# Patient Record
Sex: Male | Born: 1960 | Race: White | Hispanic: No | Marital: Single
Health system: Southern US, Community
[De-identification: ages and names within clinical notes are randomized; demographics above are authoritative.]

---

## 2005-01-12 ENCOUNTER — Emergency Department: Payer: Self-pay | Admitting: Emergency Medicine

## 2005-01-12 ENCOUNTER — Other Ambulatory Visit: Payer: Self-pay

## 2005-07-04 ENCOUNTER — Emergency Department: Payer: Self-pay | Admitting: Emergency Medicine

## 2006-05-29 ENCOUNTER — Emergency Department: Payer: Self-pay | Admitting: Emergency Medicine

## 2007-01-07 ENCOUNTER — Emergency Department: Payer: Self-pay | Admitting: Emergency Medicine

## 2008-01-11 IMAGING — CR DG CHEST 2V
1 series · 2 of 2 positions shown · non-contrast
Comparison: none

REASON FOR EXAM: sob
COMMENTS:

[Series 1: view not recorded · 0.17mm/px · 2 of 2 slices shown]
[im 1/2]
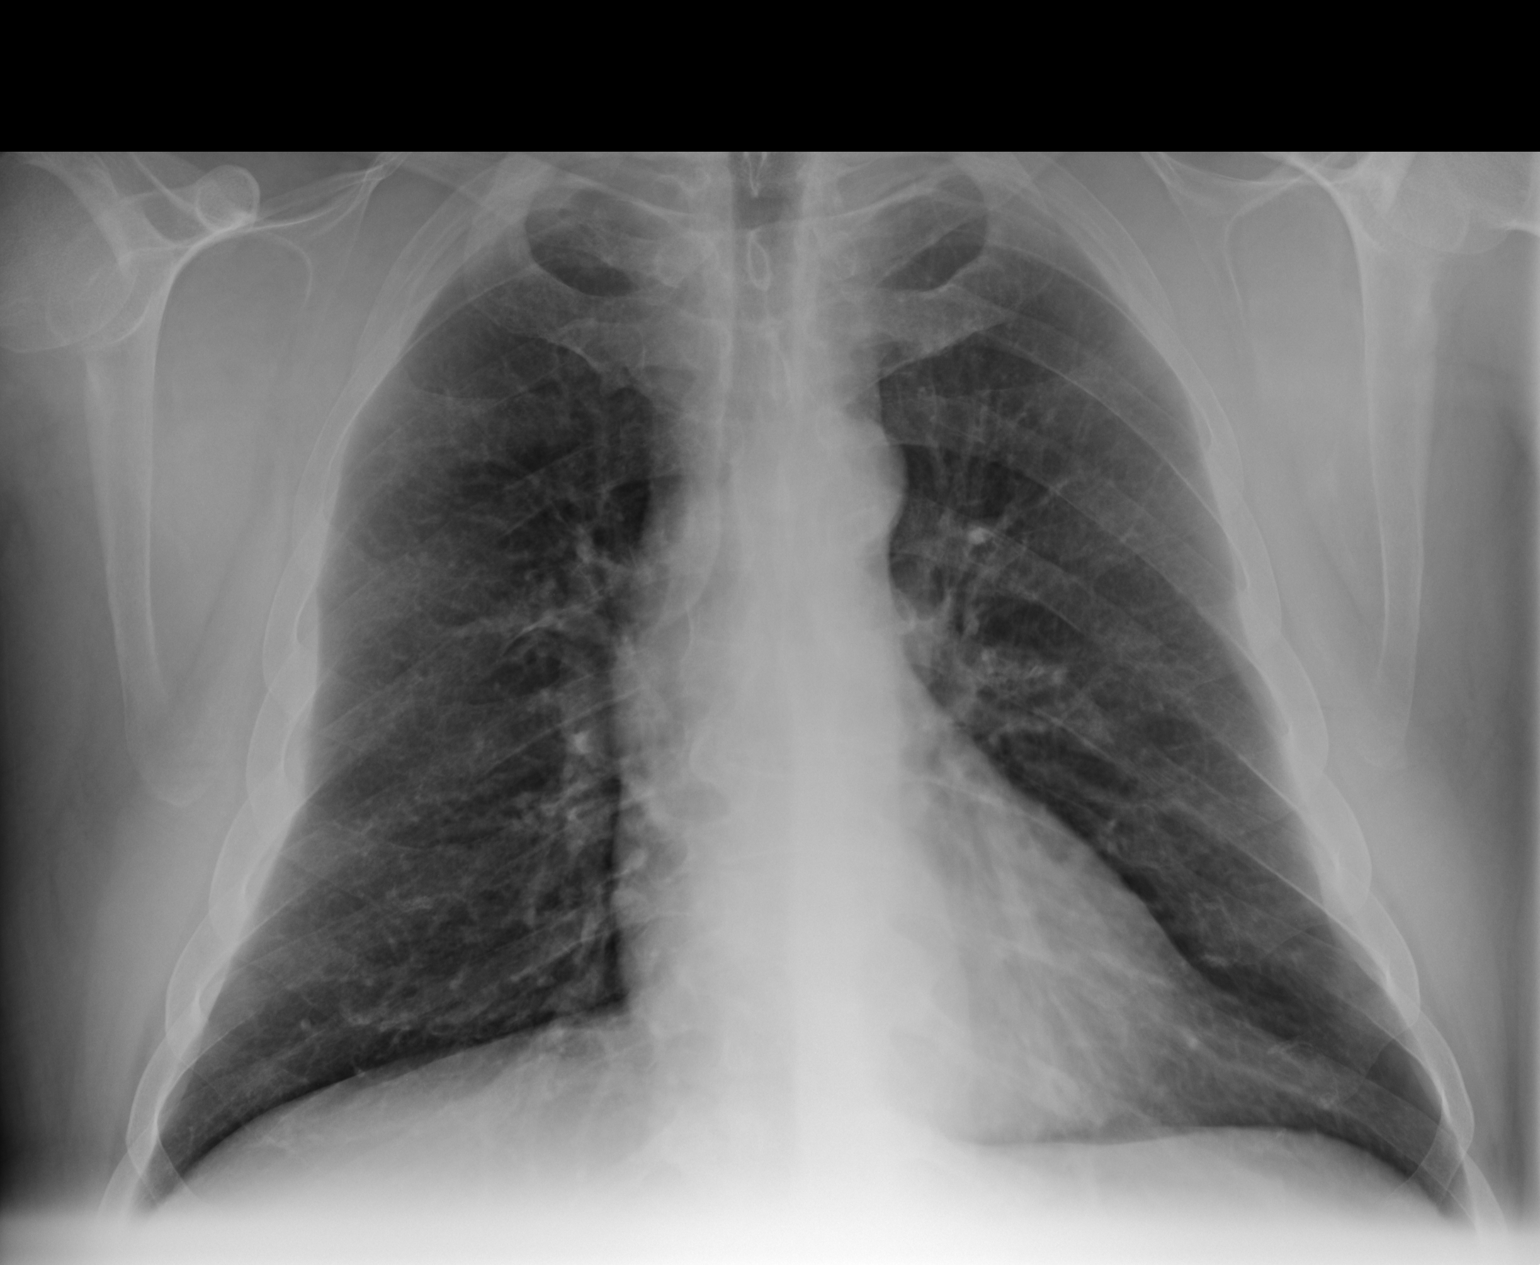
[im 2/2]
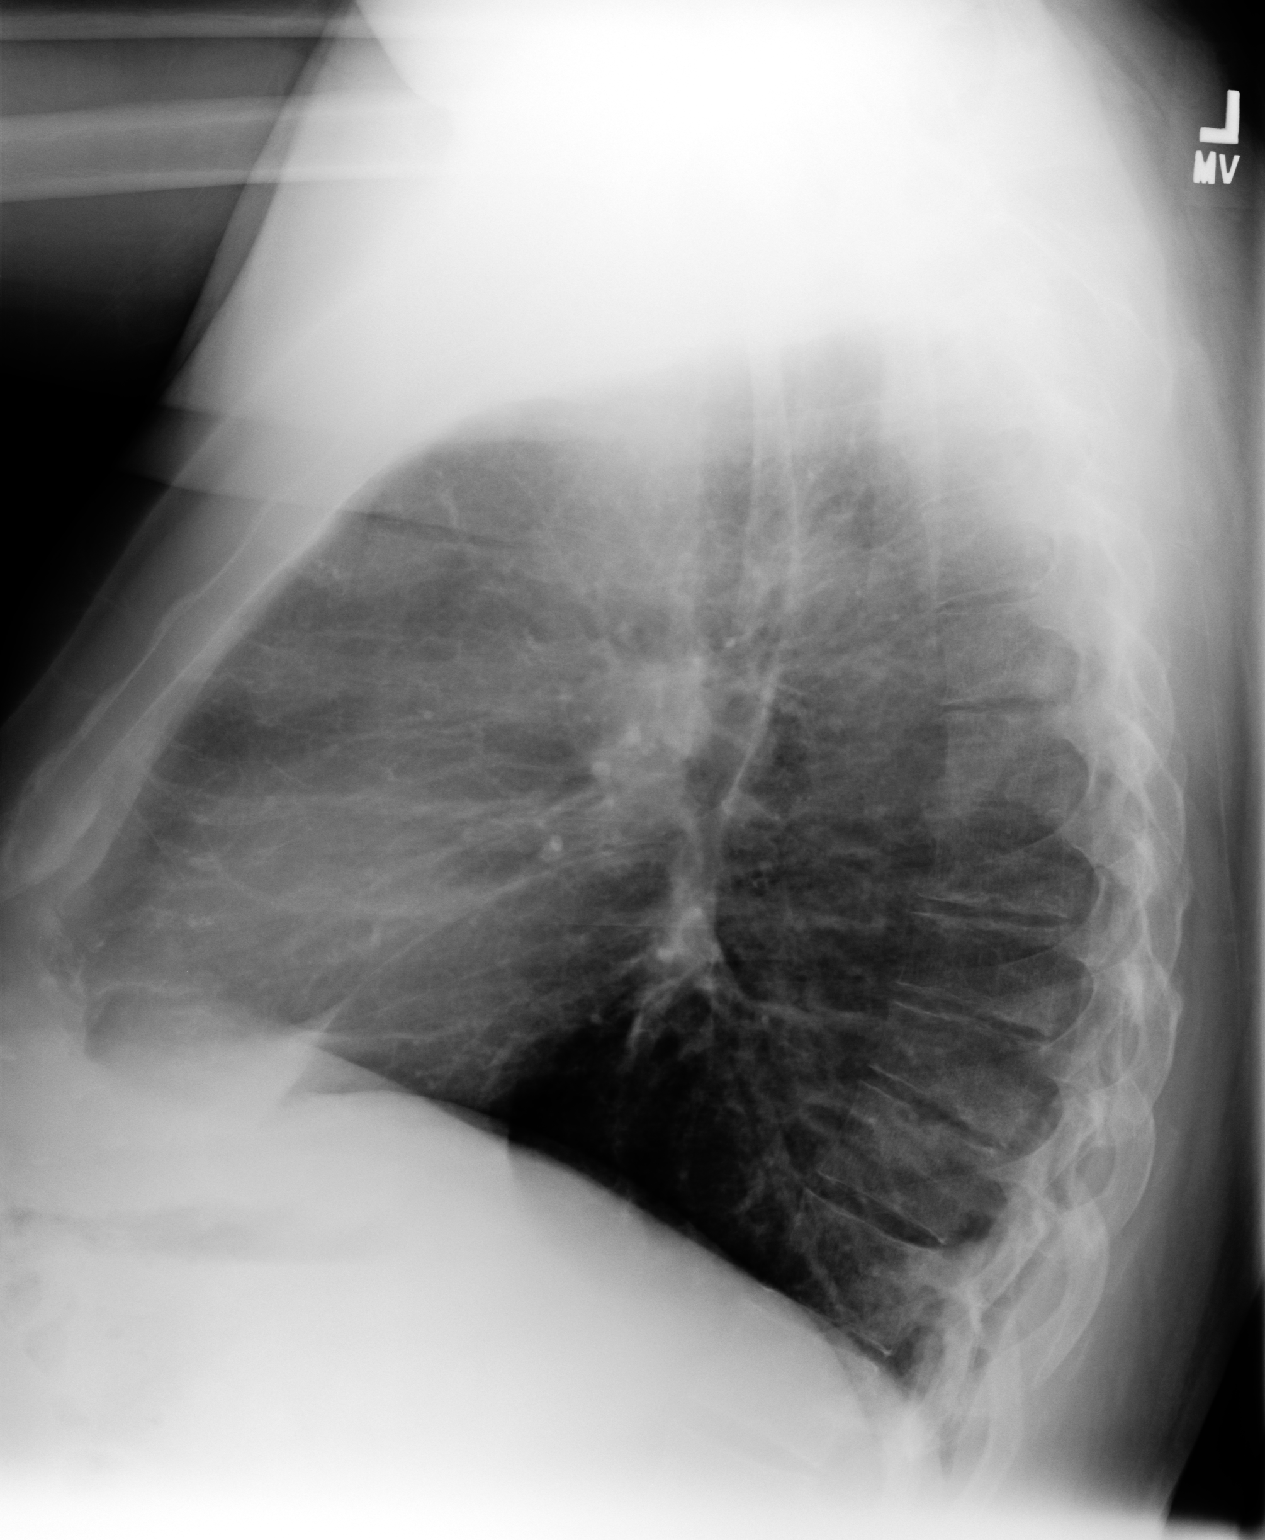

[2 of 2 positions shown; findings below may reference images not displayed]

PROCEDURE:     DXR - DXR CHEST PA (OR AP) AND LATERAL  - January 07, 2007  [DATE]

RESULT:     Comparison is made to the study dated 07/04/2005. The lungs are
hyperinflated. The lungs are clear. The heart and pulmonary vessels are
normal. The bony and mediastinal structures are unremarkable. There is no
effusion. There is no pneumothorax or evidence of congestive failure.
IMPRESSION: No acute cardiopulmonary disease. COPD changes.

## 2008-05-24 ENCOUNTER — Emergency Department: Payer: Self-pay | Admitting: Emergency Medicine

## 2011-04-30 ENCOUNTER — Emergency Department: Payer: Self-pay | Admitting: Emergency Medicine

## 2022-03-13 ENCOUNTER — Other Ambulatory Visit: Payer: Self-pay

## 2022-03-13 ENCOUNTER — Encounter
Admission: EM | Disposition: A | Discharge status: Home / Self Care | Payer: Self-pay | Source: Home / Self Care | Attending: Internal Medicine

## 2022-03-13 ENCOUNTER — Emergency Department: Payer: Medicare HMO

## 2022-03-13 ENCOUNTER — Inpatient Hospital Stay
Admission: EM | Admit: 2022-03-13 | Discharge: 2022-03-18 | DRG: 321 | Disposition: A | Discharge status: Home / Self Care | Payer: Medicare HMO | Attending: Internal Medicine | Admitting: Internal Medicine

## 2022-03-13 DIAGNOSIS — Z79899 Other long term (current) drug therapy: Secondary | ICD-10-CM | POA: Diagnosis not present

## 2022-03-13 DIAGNOSIS — L4050 Arthropathic psoriasis, unspecified: Secondary | ICD-10-CM

## 2022-03-13 DIAGNOSIS — Z6841 Body Mass Index (BMI) 40.0 and over, adult: Secondary | ICD-10-CM | POA: Diagnosis not present

## 2022-03-13 DIAGNOSIS — I5042 Chronic combined systolic (congestive) and diastolic (congestive) heart failure: Secondary | ICD-10-CM | POA: Diagnosis present

## 2022-03-13 DIAGNOSIS — Z955 Presence of coronary angioplasty implant and graft: Secondary | ICD-10-CM

## 2022-03-13 DIAGNOSIS — I472 Ventricular tachycardia, unspecified: Secondary | ICD-10-CM | POA: Diagnosis not present

## 2022-03-13 DIAGNOSIS — R310 Gross hematuria: Secondary | ICD-10-CM | POA: Diagnosis not present

## 2022-03-13 DIAGNOSIS — E785 Hyperlipidemia, unspecified: Secondary | ICD-10-CM | POA: Diagnosis present

## 2022-03-13 DIAGNOSIS — I2510 Atherosclerotic heart disease of native coronary artery without angina pectoris: Secondary | ICD-10-CM | POA: Diagnosis present

## 2022-03-13 DIAGNOSIS — J9601 Acute respiratory failure with hypoxia: Secondary | ICD-10-CM | POA: Diagnosis present

## 2022-03-13 DIAGNOSIS — F17210 Nicotine dependence, cigarettes, uncomplicated: Secondary | ICD-10-CM | POA: Diagnosis present

## 2022-03-13 DIAGNOSIS — I2111 ST elevation (STEMI) myocardial infarction involving right coronary artery: Secondary | ICD-10-CM | POA: Diagnosis present

## 2022-03-13 DIAGNOSIS — R9431 Abnormal electrocardiogram [ECG] [EKG]: Principal | ICD-10-CM

## 2022-03-13 DIAGNOSIS — G8929 Other chronic pain: Secondary | ICD-10-CM | POA: Diagnosis present

## 2022-03-13 DIAGNOSIS — G4733 Obstructive sleep apnea (adult) (pediatric): Secondary | ICD-10-CM | POA: Insufficient documentation

## 2022-03-13 DIAGNOSIS — J432 Centrilobular emphysema: Secondary | ICD-10-CM | POA: Diagnosis present

## 2022-03-13 DIAGNOSIS — G312 Degeneration of nervous system due to alcohol: Secondary | ICD-10-CM | POA: Insufficient documentation

## 2022-03-13 DIAGNOSIS — Z1152 Encounter for screening for COVID-19: Secondary | ICD-10-CM | POA: Diagnosis not present

## 2022-03-13 DIAGNOSIS — I252 Old myocardial infarction: Secondary | ICD-10-CM | POA: Diagnosis not present

## 2022-03-13 DIAGNOSIS — J441 Chronic obstructive pulmonary disease with (acute) exacerbation: Secondary | ICD-10-CM | POA: Insufficient documentation

## 2022-03-13 DIAGNOSIS — Z7982 Long term (current) use of aspirin: Secondary | ICD-10-CM

## 2022-03-13 DIAGNOSIS — I110 Hypertensive heart disease with heart failure: Secondary | ICD-10-CM | POA: Diagnosis present

## 2022-03-13 DIAGNOSIS — I2119 ST elevation (STEMI) myocardial infarction involving other coronary artery of inferior wall: Secondary | ICD-10-CM | POA: Diagnosis present

## 2022-03-13 DIAGNOSIS — M549 Dorsalgia, unspecified: Secondary | ICD-10-CM | POA: Diagnosis present

## 2022-03-13 DIAGNOSIS — J449 Chronic obstructive pulmonary disease, unspecified: Secondary | ICD-10-CM

## 2022-03-13 DIAGNOSIS — E1165 Type 2 diabetes mellitus with hyperglycemia: Secondary | ICD-10-CM | POA: Diagnosis present

## 2022-03-13 DIAGNOSIS — R079 Chest pain, unspecified: Secondary | ICD-10-CM | POA: Diagnosis present

## 2022-03-13 DIAGNOSIS — I10 Essential (primary) hypertension: Secondary | ICD-10-CM

## 2022-03-13 DIAGNOSIS — I4729 Other ventricular tachycardia: Secondary | ICD-10-CM | POA: Insufficient documentation

## 2022-03-13 DIAGNOSIS — F10931 Alcohol use, unspecified with withdrawal delirium: Secondary | ICD-10-CM | POA: Insufficient documentation

## 2022-03-13 DIAGNOSIS — F10231 Alcohol dependence with withdrawal delirium: Secondary | ICD-10-CM | POA: Diagnosis not present

## 2022-03-13 DIAGNOSIS — Z7951 Long term (current) use of inhaled steroids: Secondary | ICD-10-CM

## 2022-03-13 DIAGNOSIS — I5022 Chronic systolic (congestive) heart failure: Secondary | ICD-10-CM

## 2022-03-13 DIAGNOSIS — E66813 Obesity, class 3: Secondary | ICD-10-CM | POA: Insufficient documentation

## 2022-03-13 HISTORY — PX: CORONARY/GRAFT ACUTE MI REVASCULARIZATION: CATH118305

## 2022-03-13 HISTORY — PX: LEFT HEART CATH AND CORONARY ANGIOGRAPHY: CATH118249

## 2022-03-13 LAB — CBC WITH DIFFERENTIAL/PLATELET
Abs Immature Granulocytes: 0.08 10*3/uL — ABNORMAL HIGH (ref 0.00–0.07)
Basophils Absolute: 0.1 10*3/uL (ref 0.0–0.1)
Basophils Relative: 1 %
Eosinophils Absolute: 0.2 10*3/uL (ref 0.0–0.5)
Eosinophils Relative: 2 %
HCT: 44.1 % (ref 39.0–52.0)
Hemoglobin: 14.6 g/dL (ref 13.0–17.0)
Immature Granulocytes: 1 %
Lymphocytes Relative: 17 %
Lymphs Abs: 1.7 10*3/uL (ref 0.7–4.0)
MCH: 31.6 pg (ref 26.0–34.0)
MCHC: 33.1 g/dL (ref 30.0–36.0)
MCV: 95.5 fL (ref 80.0–100.0)
Monocytes Absolute: 1.1 10*3/uL — ABNORMAL HIGH (ref 0.1–1.0)
Monocytes Relative: 11 %
Neutro Abs: 7 10*3/uL (ref 1.7–7.7)
Neutrophils Relative %: 68 %
Platelets: 261 10*3/uL (ref 150–400)
RBC: 4.62 MIL/uL (ref 4.22–5.81)
RDW: 13 % (ref 11.5–15.5)
WBC: 10.2 10*3/uL (ref 4.0–10.5)
nRBC: 0 % (ref 0.0–0.2)

## 2022-03-13 LAB — POCT ACTIVATED CLOTTING TIME
Activated Clotting Time: 227 seconds
Activated Clotting Time: 281 seconds

## 2022-03-13 LAB — COMPREHENSIVE METABOLIC PANEL
ALT: 23 U/L (ref 0–44)
AST: 32 U/L (ref 15–41)
Albumin: 4 g/dL (ref 3.5–5.0)
Alkaline Phosphatase: 48 U/L (ref 38–126)
Anion gap: 9 (ref 5–15)
BUN: 21 mg/dL (ref 8–23)
CO2: 26 mmol/L (ref 22–32)
Calcium: 9.3 mg/dL (ref 8.9–10.3)
Chloride: 102 mmol/L (ref 98–111)
Creatinine, Ser: 1.11 mg/dL (ref 0.61–1.24)
GFR, Estimated: >60 mL/min (ref >60)
Glucose, Bld: 199 mg/dL — ABNORMAL HIGH (ref 70–99)
Potassium: 4.5 mmol/L (ref 3.5–5.1)
Sodium: 137 mmol/L (ref 135–145)
Total Bilirubin: 0.7 mg/dL (ref 0.3–1.2)
Total Protein: 7.9 g/dL (ref 6.5–8.1)

## 2022-03-13 LAB — LIPID PANEL
Cholesterol: 144 mg/dL (ref 0–200)
HDL: 43 mg/dL (ref >40)
LDL Cholesterol: 56 mg/dL (ref 0–99)
Total CHOL/HDL Ratio: 3.3 RATIO
Triglycerides: 224 mg/dL — ABNORMAL HIGH (ref <150)
VLDL: 45 mg/dL — ABNORMAL HIGH (ref 0–40)

## 2022-03-13 LAB — PROTIME-INR
INR: 1.1 (ref 0.8–1.2)
Prothrombin Time: 13.9 seconds (ref 11.4–15.2)

## 2022-03-13 LAB — APTT: aPTT: 27 seconds (ref 24–36)

## 2022-03-13 LAB — RESP PANEL BY RT-PCR (FLU A&B, COVID) ARPGX2
Influenza A by PCR: NEGATIVE
Influenza B by PCR: NEGATIVE
SARS Coronavirus 2 by RT PCR: NEGATIVE

## 2022-03-13 LAB — TROPONIN I (HIGH SENSITIVITY): Troponin I (High Sensitivity): 199 ng/L (ref <18)

## 2022-03-13 SURGERY — CORONARY/GRAFT ACUTE MI REVASCULARIZATION
Anesthesia: Moderate Sedation

## 2022-03-13 MED ORDER — FENTANYL CITRATE (PF) 100 MCG/2ML IJ SOLN
INTRAMUSCULAR | Status: AC
  Filled 2022-03-13: qty 2

## 2022-03-13 MED ORDER — FENTANYL CITRATE PF 50 MCG/ML IJ SOSY
25.0000 ug | PREFILLED_SYRINGE | Freq: Once | INTRAMUSCULAR | Status: AC
  Administered 2022-03-13: 25 ug via INTRAVENOUS
  Filled 2022-03-13: qty 1

## 2022-03-13 MED ORDER — FUROSEMIDE 10 MG/ML IJ SOLN
INTRAMUSCULAR | Status: AC
  Filled 2022-03-13: qty 4

## 2022-03-13 MED ORDER — TIROFIBAN HCL IV 12.5 MG/250 ML
INTRAVENOUS | Status: AC
  Filled 2022-03-13: qty 250

## 2022-03-13 MED ORDER — ENOXAPARIN SODIUM 80 MG/0.8ML IJ SOSY
0.5000 mg/kg | PREFILLED_SYRINGE | INTRAMUSCULAR | Status: DC
  Administered 2022-03-14: 70 mg via SUBCUTANEOUS
  Filled 2022-03-13: qty 0.8

## 2022-03-13 MED ORDER — METHYLPREDNISOLONE SODIUM SUCC 125 MG IJ SOLR
125.0000 mg | INTRAMUSCULAR | Status: AC
  Administered 2022-03-13: 125 mg via INTRAVENOUS
  Filled 2022-03-13: qty 2

## 2022-03-13 MED ORDER — FENTANYL CITRATE (PF) 100 MCG/2ML IJ SOLN
INTRAMUSCULAR | Status: DC | PRN
  Administered 2022-03-13: 25 ug via INTRAVENOUS
  Administered 2022-03-13: 50 ug via INTRAVENOUS
  Administered 2022-03-13: 25 ug via INTRAVENOUS

## 2022-03-13 MED ORDER — ATORVASTATIN CALCIUM 80 MG PO TABS
80.0000 mg | ORAL_TABLET | Freq: Every day | ORAL | Status: DC
  Administered 2022-03-14 – 2022-03-18 (×5): 80 mg via ORAL
  Filled 2022-03-13: qty 4
  Filled 2022-03-13 (×3): qty 1
  Filled 2022-03-13: qty 4

## 2022-03-13 MED ORDER — HEPARIN (PORCINE) IN NACL 1000-0.9 UT/500ML-% IV SOLN
INTRAVENOUS | Status: AC
  Filled 2022-03-13: qty 1000

## 2022-03-13 MED ORDER — IOHEXOL 300 MG/ML  SOLN
INTRAMUSCULAR | Status: DC | PRN
  Administered 2022-03-13: 230 mL

## 2022-03-13 MED ORDER — IPRATROPIUM-ALBUTEROL 0.5-2.5 (3) MG/3ML IN SOLN
3.0000 mL | Freq: Once | RESPIRATORY_TRACT | Status: DC
  Filled 2022-03-13 (×2): qty 3

## 2022-03-13 MED ORDER — LIDOCAINE HCL (PF) 1 % IJ SOLN
INTRAMUSCULAR | Status: DC | PRN
  Administered 2022-03-13: 2 mL

## 2022-03-13 MED ORDER — HEPARIN SODIUM (PORCINE) 1000 UNIT/ML IJ SOLN
INTRAMUSCULAR | Status: AC
  Filled 2022-03-13: qty 10

## 2022-03-13 MED ORDER — MIDAZOLAM HCL 2 MG/2ML IJ SOLN
INTRAMUSCULAR | Status: AC
  Filled 2022-03-13: qty 2

## 2022-03-13 MED ORDER — METOPROLOL TARTRATE 50 MG PO TABS
50.0000 mg | ORAL_TABLET | Freq: Two times a day (BID) | ORAL | Status: DC
  Administered 2022-03-14 – 2022-03-18 (×10): 50 mg via ORAL
  Filled 2022-03-13 (×10): qty 1

## 2022-03-13 MED ORDER — ASPIRIN 81 MG PO CHEW
81.0000 mg | CHEWABLE_TABLET | Freq: Every day | ORAL | Status: DC
  Administered 2022-03-14 – 2022-03-18 (×5): 81 mg via ORAL
  Filled 2022-03-13 (×5): qty 1

## 2022-03-13 MED ORDER — TICAGRELOR 90 MG PO TABS
ORAL_TABLET | ORAL | Status: AC
  Filled 2022-03-13: qty 2

## 2022-03-13 MED ORDER — SPIRONOLACTONE 25 MG PO TABS
25.0000 mg | ORAL_TABLET | Freq: Every day | ORAL | Status: DC
  Administered 2022-03-14 – 2022-03-18 (×5): 25 mg via ORAL
  Filled 2022-03-13 (×5): qty 1

## 2022-03-13 MED ORDER — ACETAMINOPHEN 325 MG PO TABS
650.0000 mg | ORAL_TABLET | Freq: Four times a day (QID) | ORAL | Status: DC | PRN
  Administered 2022-03-14 – 2022-03-17 (×5): 650 mg via ORAL
  Filled 2022-03-13 (×5): qty 2

## 2022-03-13 MED ORDER — TICAGRELOR 90 MG PO TABS
90.0000 mg | ORAL_TABLET | Freq: Two times a day (BID) | ORAL | Status: DC
  Administered 2022-03-14 – 2022-03-18 (×9): 90 mg via ORAL
  Filled 2022-03-13 (×9): qty 1

## 2022-03-13 MED ORDER — SODIUM CHLORIDE 0.9 % IV SOLN: INTRAVENOUS | Status: DC

## 2022-03-13 MED ORDER — LIDOCAINE HCL 1 % IJ SOLN
INTRAMUSCULAR | Status: AC
  Filled 2022-03-13: qty 20

## 2022-03-13 MED ORDER — ONDANSETRON HCL 4 MG/2ML IJ SOLN: 4.0000 mg | Freq: Four times a day (QID) | INTRAMUSCULAR | Status: DC | PRN

## 2022-03-13 MED ORDER — VERAPAMIL HCL 2.5 MG/ML IV SOLN
INTRAVENOUS | Status: DC | PRN
  Administered 2022-03-13: 2.5 mg via INTRAVENOUS

## 2022-03-13 MED ORDER — TRAZODONE HCL 50 MG PO TABS
25.0000 mg | ORAL_TABLET | Freq: Every evening | ORAL | Status: DC | PRN
  Administered 2022-03-14 – 2022-03-18 (×4): 25 mg via ORAL
  Filled 2022-03-13 (×4): qty 1

## 2022-03-13 MED ORDER — HEPARIN SODIUM (PORCINE) 1000 UNIT/ML IJ SOLN
INTRAMUSCULAR | Status: DC | PRN
  Administered 2022-03-13 (×3): 3000 [IU] via INTRAVENOUS
  Administered 2022-03-13: 7000 [IU] via INTRAVENOUS

## 2022-03-13 MED ORDER — CHLORHEXIDINE GLUCONATE CLOTH 2 % EX PADS
6.0000 | MEDICATED_PAD | Freq: Every day | CUTANEOUS | Status: DC
  Administered 2022-03-14 – 2022-03-18 (×5): 6 via TOPICAL

## 2022-03-13 MED ORDER — ORAL CARE MOUTH RINSE: 15.0000 mL | OROMUCOSAL | Status: DC | PRN

## 2022-03-13 MED ORDER — TIROFIBAN HCL IN NACL 5-0.9 MG/100ML-% IV SOLN
INTRAVENOUS | Status: AC | PRN
  Administered 2022-03-13: .15 ug/kg/min via INTRAVENOUS

## 2022-03-13 MED ORDER — FUROSEMIDE 10 MG/ML IJ SOLN
INTRAMUSCULAR | Status: DC | PRN
  Administered 2022-03-13: 40 mg via INTRAVENOUS

## 2022-03-13 MED ORDER — HEPARIN (PORCINE) IN NACL 1000-0.9 UT/500ML-% IV SOLN
INTRAVENOUS | Status: DC | PRN
  Administered 2022-03-13 (×2): 500 mL

## 2022-03-13 MED ORDER — ONDANSETRON HCL 4 MG PO TABS: 4.0000 mg | ORAL_TABLET | Freq: Four times a day (QID) | ORAL | Status: DC | PRN

## 2022-03-13 MED ORDER — ACETAMINOPHEN 650 MG RE SUPP: 650.0000 mg | Freq: Four times a day (QID) | RECTAL | Status: DC | PRN

## 2022-03-13 MED ORDER — LORAZEPAM 2 MG/ML IJ SOLN
INTRAMUSCULAR | Status: AC
  Administered 2022-03-14: 2 mg
  Filled 2022-03-13: qty 1

## 2022-03-13 MED ORDER — LOSARTAN POTASSIUM 50 MG PO TABS
100.0000 mg | ORAL_TABLET | Freq: Every day | ORAL | Status: DC
  Administered 2022-03-14 – 2022-03-18 (×5): 100 mg via ORAL
  Filled 2022-03-13 (×5): qty 2

## 2022-03-13 MED ORDER — TIROFIBAN (AGGRASTAT) BOLUS VIA INFUSION
INTRAVENOUS | Status: DC | PRN
  Administered 2022-03-13: 3492.5 ug via INTRAVENOUS

## 2022-03-13 MED ORDER — MAGNESIUM HYDROXIDE 400 MG/5ML PO SUSP: 30.0000 mL | Freq: Every day | ORAL | Status: DC | PRN

## 2022-03-13 MED ORDER — VERAPAMIL HCL 2.5 MG/ML IV SOLN
INTRAVENOUS | Status: AC
  Filled 2022-03-13: qty 2

## 2022-03-13 MED ORDER — TICAGRELOR 90 MG PO TABS
ORAL_TABLET | ORAL | Status: DC | PRN
  Administered 2022-03-13: 180 mg via ORAL

## 2022-03-13 MED ORDER — MIDAZOLAM HCL 2 MG/2ML IJ SOLN
INTRAMUSCULAR | Status: DC | PRN
  Administered 2022-03-13: 1 mg via INTRAVENOUS

## 2022-03-13 SURGICAL SUPPLY — 21 items
BALLN TREK RX 3.0X15 (BALLOONS) ×1
BALLOON TREK RX 3.0X15 (BALLOONS) IMPLANT
CATH 5FR JL3.5 JR4 ANG PIG MP (CATHETERS) IMPLANT
CATH VISTA GUIDE 6FR JR4 (CATHETERS) IMPLANT
DEVICE RAD TR BAND REGULAR (VASCULAR PRODUCTS) IMPLANT
GLIDESHEATH SLEND SS 6F .021 (SHEATH) IMPLANT
GUIDEWIRE INQWIRE 1.5J.035X260 (WIRE) IMPLANT
INQWIRE 1.5J .035X260CM (WIRE) ×1
KIT ENCORE 26 ADVANTAGE (KITS) IMPLANT
KIT SYRINGE INJ CVI SPIKEX1 (MISCELLANEOUS) IMPLANT
NDL PERC 18GX7CM (NEEDLE) IMPLANT
NEEDLE PERC 18GX7CM (NEEDLE) IMPLANT
PACK CARDIAC CATH (CUSTOM PROCEDURE TRAY) ×1 IMPLANT
PROTECTION STATION PRESSURIZED (MISCELLANEOUS) ×1
SET ATX SIMPLICITY (MISCELLANEOUS) IMPLANT
SHEATH AVANTI 6FR X 11CM (SHEATH) IMPLANT
STATION PROTECTION PRESSURIZED (MISCELLANEOUS) IMPLANT
STENT ONYX FRONTIER 3.0X18 (Permanent Stent) IMPLANT
TUBING CIL FLEX 10 FLL-RA (TUBING) IMPLANT
WIRE G HI TQ BMW 190 (WIRE) IMPLANT
WIRE GUIDERIGHT .035X150 (WIRE) IMPLANT

## 2022-03-13 NOTE — Assessment & Plan Note (Signed)
-   We will resume BiPAP nightly

## 2022-03-13 NOTE — ED Notes (Signed)
Pt A&Ox4. Pt ambulatory. Pt has HX of CAD, STEMix2, Stents, and COPD. Pt has been having chest pain for 4 days with it getting progressively worse today. Pt endorsing pain 8/10 after 159mcg Fentanyl given in field. Pt has wheezing noted and was placed on 4lpm O2 for comfort. Pt received a 542ml bolus of NS in the field.

## 2022-03-13 NOTE — Assessment & Plan Note (Addendum)
-   The patient underwent PCI and stent placement to his distal RCA. - He is admitted to an ICU bed. - We will continue high-dose statin therapy as well as beta-blocker therapy with Lopressor and ARB.  He will be continued on Aggrastat and Brilinta as well as aspirin. - Dr. Clayborn Bigness will follow with [place].

## 2022-03-13 NOTE — Assessment & Plan Note (Signed)
-   He has centrilobular emphysema. - We will continue him on as needed DuoNebs and hold off long-acting beta agonist.

## 2022-03-13 NOTE — ED Provider Notes (Signed)
[facility] Emergency Dept Provider Note    Event Date/Time   First MD Initiated Contact with Patient 03/13/22 2152     (approximate)   History   Chest pain  EM caveat, limitations due to concern for acute cardiopulmonary emergency  HPI  Willie Santos is a 61 y.o. male reports a history of previous heart attacks and COPD  Patient activated as possible STEMI by EMS.  Received aspirin and 100 mcg of fentanyl for pain.  He reports he had a lingering sense of chest pressure for the last 3 days but tonight it became sudden abrupt and severe with sweating and radiation up towards his neck.  He reports it seems reminiscent of previous heart attack treated at [facility]  He does smoke.  He takes aspirin daily.  He denies allergies to any medications.  He reports his pain was severe severe pressure tightness and also a little bit of a sharpness in his chest, but has been alleviated now and pain is about a 3 out of 10 but still somewhat present.     Physical Exam   Triage Vital Signs: ED Triage Vitals  Enc Vitals Group     BP 03/13/22 2155 (!) 139/99     Pulse Rate 03/13/22 2155 96     Resp 03/13/22 2155 19     Temp 03/13/22 2155 97.7 F (36.5 C)     Temp Source 03/13/22 2155 Oral     SpO2 03/13/22 2154 96 %     Weight --      Height --      Head Circumference --      Peak Flow --      Pain Score --      Pain Loc --      Pain Edu? --      Excl. in [place]? --     Most recent vital signs: Vitals:   03/13/22 2154 03/13/22 2155  BP:  (!) 139/99  Pulse:  96  Resp:  19  Temp:  97.7 F (36.5 C)  SpO2: 96% 96%     General: Awake, appears to have mild increased work of breathing but in no distress.  He is alert he is fully oriented. CV:  Good peripheral perfusion.  Normal heart tones no tachycardia. Resp:  Normal effort for mild expiratory wheezing.  He does not exhibit any accessory muscle use.  He is able to speak in clear sentences without  difficulty Abd:  No distention.  Protuberant but nontender. Other:  Appreciable lower extremity edema.  Warm well-perfused extremity   ED Results / Procedures / Treatments   Labs (all labs ordered are listed, but only abnormal results are displayed) Labs Reviewed  CBC WITH DIFFERENTIAL/PLATELET - Abnormal; Notable for the following components:      Result Value   Monocytes Absolute 1.1 (*)    Abs Immature Granulocytes 0.08 (*)    All other components within normal limits  RESP PANEL BY RT-PCR (FLU A&B, COVID) ARPGX2  HEMOGLOBIN A1C  PROTIME-INR  APTT  COMPREHENSIVE METABOLIC PANEL  LIPID PANEL  TROPONIN I (HIGH SENSITIVITY)     EKG  Patient's EKG at 2152 interpreted by me heart rate 95 QRS 120 QTc 450 ST segment elevation noted in inferior leads, mild depression in V2.  Probable old Q wave in V1.  EKG compared with previous EKG from 2006.  Concerning for acute ST elevation MI involving the inferior leads   RADIOLOGY  Increased interstitial markings bilaterally,  can't exclude infiltrates.  Interpreted by me on an emergent basis this patient is planned to go emergently to cardiac catheterization.  No noted mediastinal widening or pneumothorax   PROCEDURES:  Critical Care performed: Yes, see critical care procedure note(s)  Procedures CRITICAL CARE Performed by: Delman Kitten   Total critical care time: 30 minutes  Critical care time was exclusive of separately billable procedures and treating other patients.  Critical care was necessary to treat or prevent imminent or life-threatening deterioration.  Critical care was time spent personally by me on the following activities: development of treatment plan with patient and/or surrogate as well as nursing, discussions with consultants, evaluation of patient's response to treatment, examination of patient, obtaining history from patient or surrogate, ordering and performing treatments and interventions, ordering and review of  laboratory studies, ordering and review of radiographic studies, pulse oximetry and re-evaluation of patient's condition.   MEDICATIONS ORDERED IN ED: Medications  0.9 %  sodium chloride infusion (has no administration in time range)  ipratropium-albuterol (DUONEB) 0.5-2.5 (3) MG/3ML nebulizer solution 3 mL (3 mLs Nebulization Given 03/13/22 2207)  methylPREDNISolone sodium succinate (SOLU-MEDROL) 125 mg/2 mL injection 125 mg (125 mg Intravenous Given 03/13/22 2207)  fentaNYL (SUBLIMAZE) injection 25 mcg (25 mcg Intravenous Given 03/13/22 2207)     IMPRESSION / MDM / ASSESSMENT AND PLAN / ED COURSE  I reviewed the triage vital signs and the nursing notes.                              Differential diagnosis includes, but is not limited to, ACS, dissection though seemingly less likely given the clinical history no moving tearing or pain in the back, COPD, respiratory etiology, reactive airway disease, NSTEMI, PE etc. are all considered.  Based on the patient's initial presentation the clinical history provided is EKG my upfront early concern is for acute coronary syndrome including inferior STEMI.  Code STEMI has been activated appropriately by EMS, I have affirmed as of 9:55 PM Dr. Clayborn Bigness is on his way to the hospital at this time  Patient's pain is about 3 out of 10 now after receiving 324 mg of aspirin by EMS and 100 mcg of fentanyl.  Patient is awake and alert.  He does additionally have wheezing in the lung fields but reports that that is his "normal breathing".  He denies that he is having acute respiratory distress but uses nebulizers regularly at home.  He is a smoker.  In addition, he reports that he had severe crushing chest pain occurring this evening with this the sweating and severe symptoms that are now starting to alleviate after EMS treatments.  He does have a history of previous coronary disease and cardiac stenting at [facility]  We will provide nebulizer treatment as the  patient does have wheezing and a history of COPD, though from his descriptor it seems that this is fairly close to his baseline.  He reports he chronically wheezes and his current breathing feels normal for him.  Always very atypical as the severe chest pain he experiences evening.  Patient's presentation is most consistent with acute presentation with potential threat to life or bodily function.  The patient is on the cardiac monitor to evaluate for evidence of arrhythmia and/or significant heart rate changes.  Clinical Course as of 03/13/22 2214  Nancy Fetter Mar 13, 2022  2211 Patient to cath lab escorted by MD [facility].  Dr. Clayborn Bigness Advised no further  recommendations for ED medications, did not wish to initiate heparin in the ED. [MQ]    Clinical Course User Index [MQ] Delman Kitten, MD   Patient has left the emergency department, now under the care of the cardiology team.  CBC is normal.  Further tests are pending will be followed up by cardiology team, and I have also made our critical care medicine team aware of the patient's presentation.  At this juncture, seems possible inferior STEMI, to the Cath Lab for further evaluation.  Anticipate further work-up care and treatment under the care of the cardiology and critical care medicine teams  ----------------------------------------- 11:27 PM on 03/13/2022 ----------------------------------------- Negative COVID.  Initial troponin elevated at 199.  Normal comprehensive metabolic panel  FINAL CLINICAL IMPRESSION(S) / ED DIAGNOSES   Final diagnoses:  Inferior ST segment elevation  COPD exacerbation ([place])     Rx / DC Orders   ED Discharge Orders     None        Note:  This document was prepared using Dragon voice recognition software and may include unintentional dictation errors.   Delman Kitten, MD 03/13/22 2328

## 2022-03-13 NOTE — Assessment & Plan Note (Addendum)
-   He is on weekly methotrexate and Humira .

## 2022-03-13 NOTE — ED Triage Notes (Signed)
Per pt has been havin chest pain for 4 days with it getting worse today. Pt has HX of 2 STEMI and stents, and COPD

## 2022-03-13 NOTE — Consult Note (Signed)
CARDIOLOGY CONSULT NOTE               Patient ID: Willie Santos MRN: [id number] DOB/AGE: Apr 18, 1961 61 y.o.  Admit date: 03/13/2022 Referring Physician Dr. Jacqualine Code emergency room [facility] Primary Cardiologist  Reason for Consultation STEMI  HPI: Patient was brought in to [facility] as a code STEMI EMS activated in the field the patient was quite a bit of distance from the hospital no EKGs were transported or history was given in the field after discussion with emergency room doctor he informed me that once he sees the patient has EKG then he will call me and let me know whether it is real or not since there was no information given originally.  Once patient presents emergency room Dr. Georgiana Spinner suggested was probably a inferior STEMI so we proceeded to bring the patient directly to the cardiac Cath Lab with intention for PCI and stent if possible to treat STEMI patient was improving in the emergency room only given aspirin no heparin no nitrates symptoms been ongoing for the last 3 days but got progressively worse the night of admission where he had some sweating midsternal chest pain diaphoresis weakness fatigue shortness of breath so family called EMS who then brought him to the emergency room.  Patient has history of known coronary disease PCI and stent in the past at [facility] where he gets most of his care  Review of systems complete and found to be negative unless listed above     No past medical history on file.    Medications Prior to Admission  Medication Sig Dispense Refill Last Dose   Adalimumab (HUMIRA PEN) 40 MG/0.4ML PNKT Inject 1 Dose into the skin once a week.   Past Week   albuterol (PROVENTIL) (2.5 MG/3ML) 0.083% nebulizer solution Take 2.5 mg by nebulization every 4 (four) hours as needed.   prn at prn   albuterol (VENTOLIN HFA) 108 (90 Base) MCG/ACT inhaler Inhale 1-2 puffs into the lungs every 4 (four) hours as needed.   prn at prn   aspirin EC 81 MG  tablet Take 1 tablet by mouth daily.   Past Week   atorvastatin (LIPITOR) 40 MG tablet Take 1 tablet by mouth daily.   Past Week   Fluticasone-Umeclidin-Vilant (TRELEGY ELLIPTA) 100-62.5-25 MCG/ACT AEPB Inhale 1 puff into the lungs daily.   Past Week   folic acid (FOLVITE) 1 MG tablet Take 1 tablet by mouth daily.   Past Week   gabapentin (NEURONTIN) 300 MG capsule Take 600 mg by mouth at bedtime.   Past Week   ipratropium-albuterol (DUONEB) 0.5-2.5 (3) MG/3ML SOLN Inhale 3 mLs into the lungs every 6 (six) hours as needed.   Past Month   losartan (COZAAR) 100 MG tablet Take 100 mg by mouth daily.   Past Week   methotrexate (RHEUMATREX) 2.5 MG tablet Take 20 mg by mouth once a week. Caution:Chemotherapy. Protect from light.   Past Week   metoprolol tartrate (LOPRESSOR) 25 MG tablet Take 25 mg by mouth 2 (two) times daily.   Past Week   oxyCODONE (OXY IR/ROXICODONE) 5 MG immediate release tablet Take 5 mg by mouth 4 (four) times daily as needed.   Past Week   spironolactone (ALDACTONE) 25 MG tablet Take 25 mg by mouth daily.   Past Week   Social History   Socioeconomic History   Marital status: Single    Spouse name: Not on file   Number of children: Not on file  Years of education: Not on file   Highest education level: Not on file  Occupational History   Not on file  Tobacco Use   Smoking status: Not on file   Smokeless tobacco: Not on file  Substance and Sexual Activity   Alcohol use: Not on file   Drug use: Not on file   Sexual activity: Not on file  Other Topics Concern   Not on file  Social History Narrative   Not on file   Social Determinants of Health   Financial Resource Strain: Not on file  Food Insecurity: Not on file  Transportation Needs: Not on file  Physical Activity: Not on file  Stress: Not on file  Social Connections: Not on file  Intimate Partner Violence: Not on file    No family history on file.    Review of systems complete and found to be negative  unless listed above      PHYSICAL EXAM  General: Well developed, well nourished, in no acute distress HEENT:  Normocephalic and atramatic Neck:  No JVD.  Lungs: Clear bilaterally to auscultation and percussion. Heart: HRRR . Normal S1 and S2 without gallops or murmurs.  Abdomen: Bowel sounds are positive, abdomen soft and non-tender  Msk:  Back normal, normal gait. Normal strength and tone for age. Extremities: No clubbing, cyanosis or edema.   Neuro: Alert and oriented X 3. Psych:  Good affect, responds appropriately  Labs:   Lab Results  Component Value Date   WBC 10.2 03/13/2022   HGB 14.6 03/13/2022   HCT 44.1 03/13/2022   MCV 95.5 03/13/2022   PLT 261 03/13/2022    Recent Labs  Lab 03/13/22 2155  NA 137  K 4.5  CL 102  CO2 26  BUN 21  CREATININE 1.11  CALCIUM 9.3  PROT 7.9  BILITOT 0.7  ALKPHOS 48  ALT 23  AST 32  GLUCOSE 199*   No results found for: "CKTOTAL", "CKMB", "CKMBINDEX", "TROPONINI"  Lab Results  Component Value Date   CHOL 144 03/13/2022   Lab Results  Component Value Date   HDL 43 03/13/2022   Lab Results  Component Value Date   LDLCALC 56 03/13/2022   Lab Results  Component Value Date   TRIG 224 (H) 03/13/2022   Lab Results  Component Value Date   CHOLHDL 3.3 03/13/2022   No results found for: "LDLDIRECT"    Radiology: [facility] Chest Port 1 View  Result Date: 03/13/2022 CLINICAL DATA:  Chest pain; STEMI suspected EXAM: PORTABLE CHEST 1 VIEW COMPARISON:  radiographs 05/24/2008 FINDINGS: Defibrillator pad over the left chest. Mild cardiomegaly. Bilateral interstitial coarsening. No focal consolidation, pleural effusion, or pneumothorax. No acute osseous abnormality. IMPRESSION: Bilateral interstitial coarsening may be due to COPD though superimposed edema could be considered. Cardiomegaly. Electronically Signed   By: Minerva Fester M.D.   On: 03/13/2022 22:11    EKG: Normal sinus rhythm subtle ST elevation inferiorly reciprocal  depressions anteriorly suggestive of possible STEMI heart rate of 80  ASSESSMENT AND PLAN:  STEMI inferior wall Known coronary artery disease Previous PCI and stent Obesity Psoriatic arthritis COPD Hyperlipidemia Hypertension Probable obstructive sleep apnea Chronic back pain . Plan We will admit intention to treat take to the cardiac catheter PCI and stent to distal RCA Follow-up troponins EKGs Transferred to ICU for recovery Maintain Aggrastat for 18 hours postintervention Continue aspirin and Brilinta we will continue beta-blocker ARB and Lipitor statin Recommend weight loss exercise portion control Consider sleep study for obstructive  sleep apnea CPAP if indicated We will need to continue Humira methotrexate for psoriatic arthritis symptoms Inhalers for COPD Advised patient refrain from tobacco abuse Recommend patient follow-up with cardiac rehab upon discharge  Signed: Alwyn Pea MD 03/13/2022, 11:17 PM

## 2022-03-13 NOTE — Assessment & Plan Note (Signed)
-   We will continue his antihypertensives. 

## 2022-03-13 NOTE — Progress Notes (Signed)
PHARMACIST - PHYSICIAN COMMUNICATION  CONCERNING:  Enoxaparin (Lovenox) for DVT Prophylaxis    RECOMMENDATION: Patient was prescribed enoxaprin 40mg  q24 hours for VTE prophylaxis.   Filed Weights   03/13/22 2156  Weight: (!) 139.7 kg (308 lb)    Body mass index is 41.77 kg/m.  Estimated Creatinine Clearance: 98.7 mL/min (by C-G formula based on SCr of 1.11 mg/dL).   Based on [place] patient is candidate for enoxaparin 0.5mg /kg TBW SQ every 24 hours based on BMI being >30.  DESCRIPTION: Pharmacy has adjusted enoxaparin dose per [facility] policy.  Patient is now receiving enoxaparin 0.5 mg/kg every 24 hours   Renda Rolls, PharmD, [facility] 03/13/2022 11:47 PM

## 2022-03-13 NOTE — Assessment & Plan Note (Signed)
-   We will continue statin therapy. 

## 2022-03-13 NOTE — H&P (Signed)
[place]   PATIENT NAME: Willie Santos    MR#:  [id number]  DATE OF BIRTH:  [birth year]  DATE OF ADMISSION:  03/13/2022  PRIMARY CARE PHYSICIAN: No primary care provider on file.   Patient is coming from: Home  REQUESTING/REFERRING PHYSICIAN: Tonette Bihari, MD  CHIEF COMPLAINT:    HISTORY OF PRESENT ILLNESS:  Willie Santos is a 61 y.o. obese Caucasian male with medical history significant for Coronary artery disease, combined systolic and diastolic CHF essential hypertension, emphysema, OSA, psoriatic arthritis, depression, dyslipidemia and history of tobacco and alcohol abuse, who presented to the emergency room with acute onset of chest pain, that has been lingering chest pressure over the last 3 days but became severe and sudden and abrupt tonight with associated diaphoresis and radiation up to his neck with fatigue and generalized weakness that made his family to call EMS.  This felt similar to an MI that was previously treated at [facility]  He described his pain as tightness as well as and has improved in the ER.  No reported fever or chills.  No reported nausea or vomiting or abdominal pain.  No reported dysuria, oliguria or hematuria or flank pain.  No reported cough or wheezing or hemoptysis.  No recent travels or surgeries.  He was fairly somnolent in the ICU due to agitation and desatting that resolved by being placed on his nighttime BiPAP and he was given Ativan prior to my interview.  ED Course: Upon presentation to the emergency room BP was 139/99 with pulse symmetry of 96% on 4 L O2 by nasal cannula and otherwise normal vital signs.  Labs revealed unremarkable CMP except for blood glucose of 199.  High sensitive troponin was 199 and lipid profile showed triglycerides of 224 with LDL of 56 and HDL 43 and total cholesterol of 144.  CBC was within normal.  Coagulation profile was within normal.  Influenza antigens and COVID-19 PCR came back negative. EKG as reviewed  by me : EKG showed sinus tachycardia with a rate of 103 and nonspecific intraventricular conduction delay with ST segment elevation inferiorly and lateral ST segment depression. Imaging: EKG showed portable chest x-ray showed cardiomegaly and bilateral interstitial coarsening that may be due to COPD though superimposed edema could be considered.   Dr. Clayborn Bigness was notified about the patient for a code STEMI.  The patient was taken to the Cath Lab and underwent PCI with stent placement to his distal RCA.  He will be admitted to an ICU bed for further  management. PAST MEDICAL HISTORY:  - Coronary artery disease - Essential hypertension - Centrilobular emphysema - Obstructive sleep apnea - Psoriatic arthritis - Depression - Morbid obesity - Dyslipidemia - History of tobacco abuse - History of alcohol abuse -HFrEF with EF of 30 to 35% and grade 2 diastolic dysfunction on 2D echo in April 2022 PAST SURGICAL HISTORY:  PCI and stent to his RCA tonight PCI and stent to his mid and distal LAD stent in 2014 and 2015 Knee replacement in 2021 SOCIAL HISTORY:   Social History   Tobacco Use   Smoking status: Not on file   Smokeless tobacco: Not on file  Substance Use Topics   Alcohol use: Not on file  Positive for tobacco abuse.  FAMILY HISTORY:  No family history on file.  No pertinent or obtainable familial diseases.  DRUG ALLERGIES:  No Known Allergies  REVIEW OF SYSTEMS:   ROS As per history of present illness. All  pertinent systems were reviewed above. Constitutional, HEENT, cardiovascular, respiratory, GI, GU, musculoskeletal, neuro, psychiatric, endocrine, integumentary and hematologic systems were reviewed and are otherwise negative/unremarkable except for positive findings mentioned above in the HPI.   MEDICATIONS AT HOME:   Prior to Admission medications   Medication Sig Start Date End Date Taking? Authorizing Provider  Adalimumab (HUMIRA PEN) 40 MG/0.4ML PNKT Inject 1  Dose into the skin once a week. 04/09/21  Yes [provider]  albuterol (PROVENTIL) (2.5 MG/3ML) 0.083% nebulizer solution Take 2.5 mg by nebulization every 4 (four) hours as needed. 09/30/20  Yes [provider]  albuterol (VENTOLIN HFA) 108 (90 Base) MCG/ACT inhaler Inhale 1-2 puffs into the lungs every 4 (four) hours as needed. 07/24/20  Yes [provider]  aspirin EC 81 MG tablet Take 1 tablet by mouth daily. 06/21/19  Yes [provider]  atorvastatin (LIPITOR) 40 MG tablet Take 1 tablet by mouth daily. 09/07/21 09/07/22 Yes [provider]  Fluticasone-Umeclidin-Vilant (TRELEGY ELLIPTA) 100-62.5-25 MCG/ACT AEPB Inhale 1 puff into the lungs daily. 02/18/22  Yes [provider]  folic acid (FOLVITE) 1 MG tablet Take 1 tablet by mouth daily. 10/27/21 10/27/22 Yes [provider]  gabapentin (NEURONTIN) 300 MG capsule Take 600 mg by mouth at bedtime.   Yes [provider]  ipratropium-albuterol (DUONEB) 0.5-2.5 (3) MG/3ML SOLN Inhale 3 mLs into the lungs every 6 (six) hours as needed. 07/24/20  Yes [provider]  losartan (COZAAR) 100 MG tablet Take 100 mg by mouth daily.   Yes [provider]  methotrexate (RHEUMATREX) 2.5 MG tablet Take 20 mg by mouth once a week. Caution:Chemotherapy. Protect from light.   Yes [provider]  metoprolol tartrate (LOPRESSOR) 25 MG tablet Take 25 mg by mouth 2 (two) times daily. 03/02/22  Yes [provider]  oxyCODONE (OXY IR/ROXICODONE) 5 MG immediate release tablet Take 5 mg by mouth 4 (four) times daily as needed.   Yes [provider]  spironolactone (ALDACTONE) 25 MG tablet Take 25 mg by mouth daily. 12/02/20  Yes [provider]      VITAL SIGNS:  Blood pressure (!) 139/99, pulse 96, temperature 97.7 F (36.5 C), temperature source Oral, resp. rate 19, height 6' (1.829 m), weight (!) 139.7 kg, SpO2 96 %.  PHYSICAL EXAMINATION:   Physical Exam  GENERAL:  61 y.o.-year-old obese patient male patient lying in the bed with mild respiratory distress currently on BiPAP.  He was very somnolent after being given IV Ativan. EYES: Pupils equal, round, reactive to light and accommodation. No scleral icterus. Extraocular muscles intact.  HEENT: Head atraumatic, normocephalic. Oropharynx and nasopharynx clear.  NECK:  Supple, no jugular venous distention. No thyroid enlargement, no tenderness.  LUNGS: Normal breath sounds bilaterally, no wheezing, rales,rhonchi or crepitation. No use of accessory muscles of respiration.  CARDIOVASCULAR: Regular rate and rhythm, S1, S2 normal. No murmurs, rubs, or gallops.  ABDOMEN: Soft, nondistended, nontender. Bowel sounds present. No organomegaly or mass.  EXTREMITIES: No pedal edema, cyanosis, or clubbing.  NEUROLOGIC: Cranial nerves II through XII are intact. Muscle strength 5/5 in all extremities. Sensation intact. Gait not checked.  PSYCHIATRIC: The patient is alert and oriented x 3.  Normal affect and good eye contact. SKIN: Diffuse psoriatic eruption with erythematous papular silvery scaly eruptions mainly involving both lower extremities and lower abdomen.   LABORATORY PANEL:   CBC Recent Labs  Lab 03/13/22 2155  WBC 10.2  HGB 14.6  HCT 44.1  PLT 261   ------------------------------------------------------------------------------------------------------------------  Chemistries  Recent Labs  Lab 03/13/22 2155  NA 137  K 4.5  CL 102  CO2 26  GLUCOSE 199*  BUN 21  CREATININE 1.11  CALCIUM 9.3  AST 32  ALT 23  ALKPHOS 48  BILITOT 0.7   ------------------------------------------------------------------------------------------------------------------  Cardiac Enzymes No results for input(s): "TROPONINI" in the last 168 hours. ------------------------------------------------------------------------------------------------------------------  RADIOLOGY:  DG Chest  Port 1 View  Result Date: 03/13/2022 CLINICAL DATA:  Chest pain; STEMI suspected EXAM: PORTABLE CHEST 1 VIEW COMPARISON:  radiographs 05/24/2008 FINDINGS: Defibrillator pad over the left chest. Mild cardiomegaly. Bilateral interstitial coarsening. No focal consolidation, pleural effusion, or pneumothorax. No acute osseous abnormality. IMPRESSION: Bilateral interstitial coarsening may be due to COPD though superimposed edema could be considered. Cardiomegaly. Electronically Signed   By: Minerva Fester M.D.   On: 03/13/2022 22:11      IMPRESSION AND PLAN:  Assessment and Plan: * STEMI involving right coronary artery (HCC) - The patient underwent PCI and stent placement to his distal RCA. - He is admitted to an ICU bed. - We will continue high-dose statin therapy as well as beta-blocker therapy with Lopressor and ARB.  He will be continued on Aggrastat and Brilinta as well as aspirin. - Dr. Juliann Pares will follow with [place].  Essential hypertension - We will continue his antihypertensives.  Dyslipidemia - We will continue statin therapy.  Chronic obstructive pulmonary disease (COPD) (HCC) - He has centrilobular emphysema. - We will continue him on as needed DuoNebs and hold off long-acting beta agonist.  Chronic HFrEF (heart failure with reduced ejection fraction) (HCC) - We will continue his Lasix diuresis, beta-blocker therapy and ARB as well as Aldactone.  Psoriatic arthritis (HCC) - He is on weekly methotrexate and Humira .  Obstructive sleep apnea - We will resume BiPAP nightly   DVT prophylaxis: Lovenox.  Advanced Care Planning:  Code Status: full code.  Family Communication:  The plan of care was discussed in details with the patient (and family). I answered all questions. The patient agreed to proceed with the above mentioned plan. Further management will depend upon hospital course. Disposition Plan: Back to previous home environment Consults called:Cardiology All the  records are reviewed and case discussed with ED provider.  Status is: Inpatient   At the time of the admission, it appears that the appropriate admission status for this patient is inpatient.  This is judged to be reasonable and necessary in order to provide the required intensity of service to ensure the patient's safety given the presenting symptoms, physical exam findings and initial radiographic and laboratory data in the context of comorbid conditions.  The patient requires inpatient status due to high intensity of service, high risk of further deterioration and high frequency of surveillance required.  I certify that at the time of admission, it is my clinical judgment that the patient will require inpatient hospital care extending more than 2 midnights.                            Dispo: The patient is from: Home              Anticipated d/c is to: Home              Patient currently is not medically stable to d/c.              Difficult to place patient: No  Hannah Beat M.D on 03/14/2022 at 12:21 AM  [facility]   From 7 PM-7 AM, contact night-coverage [email or web address]  CC: Primary care physician; No primary care provider on file.

## 2022-03-13 NOTE — Progress Notes (Signed)
[facility] Progress Note Patient Name: [name] DOB: Willie 15, 1962 MRN: [id number]   Date of Service  03/13/2022  HPI/Events of Note  61/M who presents with chest pain. CODE STEMI was activated and pt was emergently brought to the cath lab. Coronary angiography revealed 100% distal lesion of the RCA. PCI with DES to RCA lesion performed. Pt tolerated procedure, no complications reported.  eICU Interventions  -admit to ICU -Currently chest pain free. Will plan to give PRN NTG if with recurrence - Continue DAPT.  - Plan to continue statin, ACE, beta blocker if without contraindication.  - Serial EKG, trend troponin to peak - Plan for echo in AM - No severe electrolyte abnormalities on baseline BMP. Will continue to monitor - Maintain normoglycemia - DVT Propyhlaxis - lovenox        Brittany Osier M DELA CRUZ 03/13/2022, 11:33 PM

## 2022-03-13 NOTE — CV Procedure (Signed)
Brief STEMI note  Indication inferior STEMI  Patient brought in via EMS with what appeared to be inferior STEMI with borderline EKG changes with persistent pain symptoms known coronary disease.  Patient had mild EKG changes inferiorly reciprocal changes anteriorly.  Patient was brought to the cardiac Cath Lab for emergent treatment  Cath Left ventricular enlargement with preserved left ventricular function around 50 to 55%  Coronary Left main large minor irregularities LAD large widely patent proximal stent minor irregularities Circumflex large mild to moderate disease RCA large 100% distal lesion TIMI 0 flow IRA  Intervention Excessive PCI and stent to distal RCA placement of a DES stent 3.0 x 18 mm [place] Onyx to 13 atm reducing lesion from 100 down to 0% restoring TIMI-3 flow from TIMI 0  Patient had procedure done radially with heparin but placed on Aggrastat postprocedure for 18 hours patient given 180 mg of Brilinta placed on 90 twice a day aspirin 325  Patient being transferred to ICU for convalescence hospitalist Dr. Sidney Ace was consulted for medical management  Patient tolerated procedure well no complications

## 2022-03-13 NOTE — ED Notes (Signed)
Cardiologist Orick in [place] at this time

## 2022-03-14 ENCOUNTER — Inpatient Hospital Stay: Payer: Medicare HMO

## 2022-03-14 ENCOUNTER — Inpatient Hospital Stay
Admit: 2022-03-14 | Discharge: 2022-03-14 | Disposition: A | Discharge status: Home / Self Care | Payer: Medicare HMO | Attending: Cardiology | Admitting: Cardiology

## 2022-03-14 DIAGNOSIS — J9601 Acute respiratory failure with hypoxia: Secondary | ICD-10-CM | POA: Insufficient documentation

## 2022-03-14 DIAGNOSIS — I4729 Other ventricular tachycardia: Secondary | ICD-10-CM | POA: Insufficient documentation

## 2022-03-14 DIAGNOSIS — F10931 Alcohol use, unspecified with withdrawal delirium: Secondary | ICD-10-CM | POA: Insufficient documentation

## 2022-03-14 DIAGNOSIS — I5022 Chronic systolic (congestive) heart failure: Secondary | ICD-10-CM

## 2022-03-14 LAB — CBC
HCT: 49.1 % (ref 39.0–52.0)
Hemoglobin: 16.1 g/dL (ref 13.0–17.0)
MCH: 31.5 pg (ref 26.0–34.0)
MCHC: 32.8 g/dL (ref 30.0–36.0)
MCV: 96.1 fL (ref 80.0–100.0)
Platelets: 299 10*3/uL (ref 150–400)
RBC: 5.11 MIL/uL (ref 4.22–5.81)
RDW: 13.1 % (ref 11.5–15.5)
WBC: 9.4 10*3/uL (ref 4.0–10.5)
nRBC: 0 % (ref 0.0–0.2)

## 2022-03-14 LAB — BLOOD GAS, ARTERIAL
Acid-Base Excess: 3.4 mmol/L — ABNORMAL HIGH (ref 0.0–2.0)
Bicarbonate: 29.6 mmol/L — ABNORMAL HIGH (ref 20.0–28.0)
O2 Content: 10 L/min
O2 Saturation: 99.1 %
Patient temperature: 37
pCO2 arterial: 50 mmHg — ABNORMAL HIGH (ref 32–48)
pH, Arterial: 7.38 (ref 7.35–7.45)
pO2, Arterial: 119 mmHg — ABNORMAL HIGH (ref 83–108)

## 2022-03-14 LAB — BASIC METABOLIC PANEL
Anion gap: 11 (ref 5–15)
BUN: 26 mg/dL — ABNORMAL HIGH (ref 8–23)
CO2: 26 mmol/L (ref 22–32)
Calcium: 9.6 mg/dL (ref 8.9–10.3)
Chloride: 103 mmol/L (ref 98–111)
Creatinine, Ser: 0.92 mg/dL (ref 0.61–1.24)
GFR, Estimated: >60 mL/min (ref >60)
Glucose, Bld: 195 mg/dL — ABNORMAL HIGH (ref 70–99)
Potassium: 5 mmol/L (ref 3.5–5.1)
Sodium: 140 mmol/L (ref 135–145)

## 2022-03-14 LAB — CARDIAC CATHETERIZATION: Cath EF Quantitative: 50 %

## 2022-03-14 LAB — BRAIN NATRIURETIC PEPTIDE: B Natriuretic Peptide: 177.7 pg/mL — ABNORMAL HIGH (ref 0.0–100.0)

## 2022-03-14 LAB — HEMOGLOBIN A1C
Hgb A1c MFr Bld: 6.8 % — ABNORMAL HIGH (ref 4.8–5.6)
Mean Plasma Glucose: 148.46 mg/dL

## 2022-03-14 LAB — MRSA NEXT GEN BY PCR, NASAL: MRSA by PCR Next Gen: NOT DETECTED

## 2022-03-14 LAB — HIV ANTIBODY (ROUTINE TESTING W REFLEX): HIV Screen 4th Generation wRfx: NONREACTIVE

## 2022-03-14 LAB — GLUCOSE, CAPILLARY: Glucose-Capillary: 148 mg/dL — ABNORMAL HIGH (ref 70–99)

## 2022-03-14 LAB — TROPONIN I (HIGH SENSITIVITY): Troponin I (High Sensitivity): >24000 ng/L (ref <18)

## 2022-03-14 MED ORDER — FOLIC ACID 1 MG PO TABS
1.0000 mg | ORAL_TABLET | Freq: Every day | ORAL | Status: DC
  Filled 2022-03-14: qty 1

## 2022-03-14 MED ORDER — METHYLPREDNISOLONE SODIUM SUCC 40 MG IJ SOLR
40.0000 mg | Freq: Two times a day (BID) | INTRAMUSCULAR | Status: DC
  Administered 2022-03-14 – 2022-03-16 (×4): 40 mg via INTRAVENOUS
  Filled 2022-03-14 (×4): qty 1

## 2022-03-14 MED ORDER — LORAZEPAM 1 MG PO TABS
1.0000 mg | ORAL_TABLET | ORAL | Status: AC | PRN
  Administered 2022-03-15: 1 mg via ORAL
  Filled 2022-03-14: qty 1

## 2022-03-14 MED ORDER — ADULT MULTIVITAMIN W/MINERALS CH
1.0000 | ORAL_TABLET | Freq: Every day | ORAL | Status: DC
  Administered 2022-03-14 – 2022-03-18 (×5): 1 via ORAL
  Filled 2022-03-14 (×5): qty 1

## 2022-03-14 MED ORDER — HEPARIN BOLUS VIA INFUSION
6550.0000 [IU] | Freq: Once | INTRAVENOUS | Status: DC
  Filled 2022-03-14: qty 6550

## 2022-03-14 MED ORDER — ACETAMINOPHEN 325 MG PO TABS: 650.0000 mg | ORAL_TABLET | ORAL | Status: DC | PRN

## 2022-03-14 MED ORDER — ENOXAPARIN SODIUM 80 MG/0.8ML IJ SOSY
70.0000 mg | PREFILLED_SYRINGE | INTRAMUSCULAR | Status: DC
  Administered 2022-03-15 – 2022-03-16 (×2): 70 mg via SUBCUTANEOUS
  Filled 2022-03-14 (×2): qty 0.8

## 2022-03-14 MED ORDER — SODIUM CHLORIDE 0.9 % WEIGHT BASED INFUSION
1.0000 mL/kg/h | INTRAVENOUS | Status: AC
  Administered 2022-03-14: 1 mL/kg/h via INTRAVENOUS

## 2022-03-14 MED ORDER — THIAMINE HCL 100 MG/ML IJ SOLN
100.0000 mg | Freq: Every day | INTRAMUSCULAR | Status: DC
  Filled 2022-03-14: qty 2

## 2022-03-14 MED ORDER — METHOTREXATE SODIUM 2.5 MG PO TABS
20.0000 mg | ORAL_TABLET | ORAL | Status: DC
  Administered 2022-03-14: 20 mg via ORAL
  Filled 2022-03-14: qty 8

## 2022-03-14 MED ORDER — OXYCODONE HCL 5 MG PO TABS
5.0000 mg | ORAL_TABLET | ORAL | Status: DC | PRN
  Administered 2022-03-14 – 2022-03-18 (×15): 5 mg via ORAL
  Filled 2022-03-14 (×15): qty 1

## 2022-03-14 MED ORDER — TIROFIBAN HCL IV 12.5 MG/250 ML: 0.1500 ug/kg/min | INTRAVENOUS | Status: AC

## 2022-03-14 MED ORDER — ONDANSETRON HCL 4 MG/2ML IJ SOLN: 4.0000 mg | Freq: Four times a day (QID) | INTRAMUSCULAR | Status: DC | PRN

## 2022-03-14 MED ORDER — PERFLUTREN LIPID MICROSPHERE
1.0000 mL | INTRAVENOUS | Status: AC | PRN
  Administered 2022-03-14: 2 mL via INTRAVENOUS

## 2022-03-14 MED ORDER — LORAZEPAM 2 MG/ML IJ SOLN: 1.0000 mg | INTRAMUSCULAR | Status: AC | PRN

## 2022-03-14 MED ORDER — FOLIC ACID 1 MG PO TABS
1.0000 mg | ORAL_TABLET | Freq: Every day | ORAL | Status: DC
  Administered 2022-03-14 – 2022-03-18 (×5): 1 mg via ORAL
  Filled 2022-03-14 (×5): qty 1

## 2022-03-14 MED ORDER — GABAPENTIN 300 MG PO CAPS
600.0000 mg | ORAL_CAPSULE | Freq: Every day | ORAL | Status: DC
  Administered 2022-03-14 – 2022-03-17 (×4): 600 mg via ORAL
  Filled 2022-03-14 (×4): qty 2

## 2022-03-14 MED ORDER — LABETALOL HCL 5 MG/ML IV SOLN: 10.0000 mg | INTRAVENOUS | Status: AC | PRN

## 2022-03-14 MED ORDER — SODIUM CHLORIDE 0.9 % IV SOLN: 250.0000 mL | INTRAVENOUS | Status: DC | PRN

## 2022-03-14 MED ORDER — SODIUM CHLORIDE 0.9% FLUSH: 3.0000 mL | INTRAVENOUS | Status: DC | PRN

## 2022-03-14 MED ORDER — NICOTINE 21 MG/24HR TD PT24
21.0000 mg | MEDICATED_PATCH | Freq: Every day | TRANSDERMAL | Status: DC
  Administered 2022-03-15 – 2022-03-18 (×4): 21 mg via TRANSDERMAL
  Filled 2022-03-14 (×4): qty 1

## 2022-03-14 MED ORDER — LOSARTAN POTASSIUM 50 MG PO TABS: 100.0000 mg | ORAL_TABLET | Freq: Every day | ORAL | Status: DC

## 2022-03-14 MED ORDER — ATORVASTATIN CALCIUM 20 MG PO TABS: 40.0000 mg | ORAL_TABLET | Freq: Every day | ORAL | Status: DC

## 2022-03-14 MED ORDER — LORAZEPAM 2 MG/ML IJ SOLN: 2.0000 mg | Freq: Once | INTRAMUSCULAR | Status: AC

## 2022-03-14 MED ORDER — HEPARIN (PORCINE) 25000 UT/250ML-% IV SOLN: 1750.0000 [IU]/h | INTRAVENOUS | Status: DC

## 2022-03-14 MED ORDER — IPRATROPIUM-ALBUTEROL 0.5-2.5 (3) MG/3ML IN SOLN
3.0000 mL | Freq: Four times a day (QID) | RESPIRATORY_TRACT | Status: DC
  Administered 2022-03-14 – 2022-03-18 (×15): 3 mL via RESPIRATORY_TRACT
  Filled 2022-03-14 (×16): qty 3
  Filled 2022-03-14: qty 30

## 2022-03-14 MED ORDER — SODIUM CHLORIDE 0.9 % IV SOLN: INTRAVENOUS | Status: DC

## 2022-03-14 MED ORDER — IPRATROPIUM-ALBUTEROL 0.5-2.5 (3) MG/3ML IN SOLN
3.0000 mL | Freq: Four times a day (QID) | RESPIRATORY_TRACT | Status: DC | PRN
  Administered 2022-03-16: 3 mL via RESPIRATORY_TRACT
  Filled 2022-03-14 (×2): qty 3

## 2022-03-14 MED ORDER — HYDRALAZINE HCL 20 MG/ML IJ SOLN: 10.0000 mg | INTRAMUSCULAR | Status: AC | PRN

## 2022-03-14 MED ORDER — SPIRONOLACTONE 25 MG PO TABS: 25.0000 mg | ORAL_TABLET | Freq: Every day | ORAL | Status: DC

## 2022-03-14 MED ORDER — SODIUM CHLORIDE 0.9% FLUSH
3.0000 mL | Freq: Two times a day (BID) | INTRAVENOUS | Status: DC
  Administered 2022-03-14 – 2022-03-18 (×8): 3 mL via INTRAVENOUS

## 2022-03-14 MED ORDER — METOPROLOL TARTRATE 25 MG PO TABS: 25.0000 mg | ORAL_TABLET | Freq: Two times a day (BID) | ORAL | Status: DC

## 2022-03-14 MED ORDER — LORAZEPAM 2 MG/ML IJ SOLN
0.5000 mg | INTRAMUSCULAR | Status: DC | PRN
  Administered 2022-03-14: 0.5 mg via INTRAVENOUS
  Filled 2022-03-14: qty 1

## 2022-03-14 MED ORDER — THIAMINE MONONITRATE 100 MG PO TABS
100.0000 mg | ORAL_TABLET | Freq: Every day | ORAL | Status: DC
  Administered 2022-03-14 – 2022-03-18 (×5): 100 mg via ORAL
  Filled 2022-03-14 (×5): qty 1

## 2022-03-14 NOTE — Assessment & Plan Note (Signed)
-   We will continue his Lasix diuresis, beta-blocker therapy and ARB as well as Aldactone.

## 2022-03-14 NOTE — Progress Notes (Signed)
[facility] Cardiology    SUBJECTIVE: Patient states to feel much better has had some difficulty urinating requiring Foley also requiring BiPAP for shortness of breath sitting up in a chair he has chronic back pain denies any chest pain feels much improved   Vitals:   03/14/22 0222 03/14/22 0300 03/14/22 0400 03/14/22 0700  BP:  93/69 102/74 96/70  Pulse: 80 85 81 73  Resp: 13 16 14 14   Temp:      TempSrc:      SpO2: 96% 96% 96% 97%  Weight:      Height:         Intake/Output Summary (Last 24 hours) at 03/14/2022 13/10/2021 Last data filed at 03/14/2022 0300 Gross per 24 hour  Intake 912.27 ml  Output 1100 ml  Net -187.73 ml      PHYSICAL EXAM  General: Well developed, well nourished, in no acute distress HEENT:  Normocephalic and atramatic Neck:  No JVD.  Lungs: Clear bilaterally to auscultation and percussion. Heart: HRRR . Normal S1 and S2 without gallops or murmurs.  Abdomen: Bowel sounds are positive, abdomen soft and non-tender  Msk:  Back normal, normal gait. Normal strength and tone for age. Extremities: No clubbing, cyanosis or edema.   Neuro: Alert and oriented X 3. Psych:  Good affect, responds appropriately   LABS: Basic Metabolic Panel: Recent Labs    03/13/22 2155  NA 137  K 4.5  CL 102  CO2 26  GLUCOSE 199*  BUN 21  CREATININE 1.11  CALCIUM 9.3   Liver Function Tests: Recent Labs    03/13/22 2155  AST 32  ALT 23  ALKPHOS 48  BILITOT 0.7  PROT 7.9  ALBUMIN 4.0   No results for input(s): "LIPASE", "AMYLASE" in the last 72 hours. CBC: Recent Labs    03/13/22 2155  WBC 10.2  NEUTROABS 7.0  HGB 14.6  HCT 44.1  MCV 95.5  PLT 261   Cardiac Enzymes: No results for input(s): "CKTOTAL", "CKMB", "CKMBINDEX", "TROPONINI" in the last 72 hours. BNP: Invalid input(s): "POCBNP" D-Dimer: No results for input(s): "DDIMER" in the last 72 hours. Hemoglobin A1C: Recent Labs    03/13/22 2155  HGBA1C 6.8*   Fasting Lipid Panel: Recent Labs     03/13/22 2155  CHOL 144  HDL 43  LDLCALC 56  TRIG 224*  CHOLHDL 3.3   Thyroid Function Tests: No results for input(s): "TSH", "T4TOTAL", "T3FREE", "THYROIDAB" in the last 72 hours.  Invalid input(s): "FREET3" Anemia Panel: No results for input(s): "VITAMINB12", "FOLATE", "FERRITIN", "TIBC", "IRON", "RETICCTPCT" in the last 72 hours.  DG Chest Port 1 View  Result Date: 03/13/2022 CLINICAL DATA:  Chest pain; STEMI suspected EXAM: PORTABLE CHEST 1 VIEW COMPARISON:  radiographs 05/24/2008 FINDINGS: Defibrillator pad over the left chest. Mild cardiomegaly. Bilateral interstitial coarsening. No focal consolidation, pleural effusion, or pneumothorax. No acute osseous abnormality. IMPRESSION: Bilateral interstitial coarsening may be due to COPD though superimposed edema could be considered. Cardiomegaly. Electronically Signed   By: 05/26/2008 M.D.   On: 03/13/2022 22:11     Echo pending  TELEMETRY: Normal sinus rhythm rate of 80ST-T changes:  ASSESSMENT AND PLAN:  Principal Problem:   STEMI involving right coronary artery (HCC) Active Problems:   Acute ST elevation myocardial infarction (STEMI) involving other coronary artery of inferior wall (HCC)   Dyslipidemia   Essential hypertension   Chronic obstructive pulmonary disease (COPD) (HCC)   Psoriatic arthritis (HCC)   Obstructive sleep apnea   Chronic HFrEF (heart  failure with reduced ejection fraction) ([place])    Plan Status post STEMI PCI and stent to distal RCA DES continue Brilinta aspirin wean Aggrastat after a total of 18 hours Respiratory distress COPD recommend BiPAP diuretics Sleep study CPAP if indicated for probable obstructive sleep apnea Continue hypertension management and control Recommend continue statin therapy for hyperlipidemia Follow-up troponins EKGs maintain beta-blockers ACE or ARB aspirin statin Brilinta Recommend significant weight loss exercise portion control .  Refer patient to cardiac rehab upon  discharge Hopefully transfer to floor bed tomorrow   Yolonda Kida, MD 03/14/2022 7:28 AM

## 2022-03-14 NOTE — Progress Notes (Addendum)
Progress Note   Patient: Willie Santos QZR:[id number] DOB: 1960-07-05 DOA: 03/13/2022     1 DOS: the patient was seen and examined on 03/14/2022   Brief hospital course: Willie Santos is a 61 y.o. obese Caucasian male with medical history significant for Coronary artery disease, combined systolic and diastolic CHF essential hypertension, emphysema, OSA, psoriatic arthritis, depression, dyslipidemia and history of tobacco and alcohol abuse, who presented to the emergency room with acute onset of chest pain.  Peak troponin was more than 24,000.  EKG showed ST elevation.  Patient with emergency brought to the Cath Lab, drug-eluting eluding stent was placed in RCA, patient started on dual antiplatelet treatment. Overnight over the procedure, patient developed significant agitation and confusion.  He was also placed on BiPAP for hypoxemia.  Assessment and Plan:  Acute hypoxemic respiratory failure. Developed significant respiratory distress as well as hypoxemia last night in the setting of altered mental status and agitation.  Reviewed patient chest x-ray, has some bilateral lower lobe atelectasis. Repeated chest x-ray today.  Due to significant agitation and confusion last night, patient could have an aspiration event. Check a BNP and a pending echocardiogram, will discontinue IV fluids for now.  Patient may need diuretics if significant elevation of BNP. Another consideration is that the patient has a severe sleep apnea, with altered mental status, patient could develop hypoxemia.  Obtain ABG to assess the need for BiPAP at time of discharge.  STEMI secondary to obstructed RCA. Nonsustained ventricular tachycardia. Patient is status post RCA drug-eluting stent.  Continue dual antiplatelets as well as statin. Patient had nonsustained ventricular tachycardia of 8 beats, most likely this is secondary to reperfusion arrhythmia.  Continue to follow closely.  Delirium tremens with alcohol  abuse. Patient is a heavy alcohol drinker, per agitated and confused last night.  We will start CIWA protocol, also start thiamine and folic acid.  COPD. Obstructive sleep apnea. Continue bronchodilator, patient was not on BiPAP at home.  We will assess for the need.  Chronic systolic congestive heart failure. Pending echocardiogram.  Assess volume status with BNP.  Chest x-ray showed some bilateral lower lobe atelectasis.  May need diuretics.  Discontinued IV fluids.  Morbid obesity. Diet exercise.  Psoriasis. Psoriatic arthritis. Patient has chronic skin rash and a psoriatic arthritis.  Currently on methotrexate and Humira.    Addendum: 1546, BNP 177, repeated chest x-ray did not show any changes compared to prior to someone.  Patient unlikely to had aspiration.  ABG showed pH of 7.38, CO2 of 50,  O2 of 119.  Patient is still requiring BiPAP.  Patient  has COPD, I will start IV steroids. Patient states that he has significant shortness of breath with any activity at baseline, even when urinating. This could be all due to severe COPD. Suspicion for PE is low. He also has gross hematuria, will not start iv heparin. Continue prophylactic lovenox Continue monitor closely in the ICU stepdown unit.   Subjective:  Patient has been very combative and confused last night.  Had another episode of agitation this morning. Denies any chest pain or shortness of breath.  Physical Exam: Vitals:   03/14/22 1030 03/14/22 1100 03/14/22 1130 03/14/22 1200  BP: 101/60 (!) 115/98 110/71 104/69  Pulse: 84 92 88 83  Resp: 19     Temp:      TempSrc:      SpO2: 96% 96% 96% 99%  Weight:      Height:  General exam: Appears calm and comfortable  Respiratory system: Clear to auscultation. Respiratory effort normal. Cardiovascular system: S1 & S2 heard, RRR. No JVD, murmurs, rubs, gallops or clicks. No pedal edema. Gastrointestinal system: Abdomen is nondistended, soft and nontender. No  organomegaly or masses felt. Normal bowel sounds heard. Central nervous system: Alert and oriented x2. No focal neurological deficits. Extremities: Symmetric 5 x 5 power. Skin: No rashes, lesions or ulcers Psychiatry:  Mood & affect appropriate.   Data Reviewed:  Reviewed chest x-ray, heart cath report, lab results, EKG.  Family Communication: Son updated at bedside.  Disposition: Status is: Inpatient Remains inpatient appropriate because: Severity of disease, IV treatment.  Altered mental status.  Planned Discharge Destination: Home with Home Health    Time spent: 55 minutes  Author: Sharen Hones, MD 03/14/2022 12:13 PM  For on call review [email or web address].

## 2022-03-14 NOTE — Hospital Course (Signed)
Willie Santos is a 61 y.o. obese Caucasian male with medical history significant for Coronary artery disease, combined systolic and diastolic CHF essential hypertension, emphysema, OSA, psoriatic arthritis, depression, dyslipidemia and history of tobacco and alcohol abuse, who presented to the emergency room with acute onset of chest pain.  Peak troponin was more than 24,000.  EKG showed ST elevation.  Patient with emergency brought to the Cath Lab, drug-eluting eluding stent was placed in RCA, patient started on dual antiplatelet treatment. Overnight over the procedure, patient developed significant agitation and confusion.  He was also placed on BiPAP for hypoxemia.

## 2022-03-14 NOTE — Progress Notes (Signed)
ANTICOAGULATION CONSULT NOTE - Initial Consult  Pharmacy Consult for heparin Indication: pulmonary embolus  No Known Allergies  Patient Measurements: Height: 6' (182.9 cm) Weight: (!) 139.7 kg (308 lb) IBW/kg (Calculated) : 77.6 Heparin Dosing Weight: 109.8 kg  Vital Signs: Temp: 97.9 F (36.6 C) (11/06 1200) Temp Source: Axillary (11/06 0800) BP: 114/63 (11/06 1600) Pulse Rate: 96 (11/06 1600)  Labs: Recent Labs    03/13/22 2155 03/14/22 0103 03/14/22 0926  HGB 14.6  --  16.1  HCT 44.1  --  49.1  PLT 261  --  299  APTT 27  --   --   LABPROT 13.9  --   --   INR 1.1  --   --   CREATININE 1.11  --  0.92  TROPONINIHS 199* >24,000*  --     Estimated Creatinine Clearance: 119 mL/min (by C-G formula based on SCr of 0.92 mg/dL).   Medical History: No past medical history on file.  Medications:  Previously receiving enoxaparin ppx  Assessment:  61 y.o. obese Caucasian male with medical history significant for Coronary artery disease, combined systolic and diastolic CHF essential hypertension, emphysema, OSA, psoriatic arthritis, depression, dyslipidemia and history of tobacco and alcohol abuse, who presented to the emergency room with acute onset of chest pain. Concern for PE with acute respiratory failure.  Goal of Therapy:  Heparin level 0.3-0.7 units/ml Monitor platelets by anticoagulation protocol: Yes   Plan:  Give 6550 units bolus x 1 Start heparin infusion at 1750 units/hr Check anti-Xa level in 6 hours and daily while on heparin Continue to monitor H&H and platelets  Willie Santos 03/14/2022,4:13 PM

## 2022-03-15 ENCOUNTER — Encounter: Payer: Self-pay | Admitting: Internal Medicine

## 2022-03-15 DIAGNOSIS — J441 Chronic obstructive pulmonary disease with (acute) exacerbation: Secondary | ICD-10-CM

## 2022-03-15 DIAGNOSIS — J9601 Acute respiratory failure with hypoxia: Secondary | ICD-10-CM | POA: Diagnosis not present

## 2022-03-15 DIAGNOSIS — I2111 ST elevation (STEMI) myocardial infarction involving right coronary artery: Secondary | ICD-10-CM | POA: Diagnosis not present

## 2022-03-15 LAB — ECHOCARDIOGRAM COMPLETE
AR max vel: 1.85 cm2
AV Area VTI: 1.87 cm2
AV Area mean vel: 1.77 cm2
AV Mean grad: 12 mmHg
AV Peak grad: 21.2 mmHg
Ao pk vel: 2.3 m/s
Area-P 1/2: 3.99 cm2
Height: 72 in
MV M vel: 5.01 m/s
MV Peak grad: 100.4 mmHg
Radius: 0.8 cm
S' Lateral: 5.8 cm
Weight: 4928 oz

## 2022-03-15 LAB — BASIC METABOLIC PANEL
Anion gap: 4 — ABNORMAL LOW (ref 5–15)
BUN: 24 mg/dL — ABNORMAL HIGH (ref 8–23)
CO2: 33 mmol/L — ABNORMAL HIGH (ref 22–32)
Calcium: 8.8 mg/dL — ABNORMAL LOW (ref 8.9–10.3)
Chloride: 103 mmol/L (ref 98–111)
Creatinine, Ser: 0.55 mg/dL — ABNORMAL LOW (ref 0.61–1.24)
GFR, Estimated: >60 mL/min (ref >60)
Glucose, Bld: 179 mg/dL — ABNORMAL HIGH (ref 70–99)
Potassium: 4.7 mmol/L (ref 3.5–5.1)
Sodium: 140 mmol/L (ref 135–145)

## 2022-03-15 LAB — CBC
HCT: 41.9 % (ref 39.0–52.0)
Hemoglobin: 13.9 g/dL (ref 13.0–17.0)
MCH: 32 pg (ref 26.0–34.0)
MCHC: 33.2 g/dL (ref 30.0–36.0)
MCV: 96.3 fL (ref 80.0–100.0)
Platelets: 266 10*3/uL (ref 150–400)
RBC: 4.35 MIL/uL (ref 4.22–5.81)
RDW: 12.6 % (ref 11.5–15.5)
WBC: 14.8 10*3/uL — ABNORMAL HIGH (ref 4.0–10.5)
nRBC: 0 % (ref 0.0–0.2)

## 2022-03-15 LAB — MAGNESIUM: Magnesium: 2.2 mg/dL (ref 1.7–2.4)

## 2022-03-15 LAB — PHOSPHORUS: Phosphorus: 3.4 mg/dL (ref 2.5–4.6)

## 2022-03-15 LAB — LIPOPROTEIN A (LPA): Lipoprotein (a): 70.1 nmol/L — ABNORMAL HIGH (ref <75.0)

## 2022-03-15 MED ORDER — FUROSEMIDE 10 MG/ML IJ SOLN
40.0000 mg | Freq: Once | INTRAMUSCULAR | Status: AC
  Administered 2022-03-15: 40 mg via INTRAVENOUS
  Filled 2022-03-15: qty 4

## 2022-03-15 NOTE — Progress Notes (Signed)
Patient arrived to room 243 in NAD. Patient oriented to room and call bell lin reach.

## 2022-03-15 NOTE — Progress Notes (Signed)
Pt a/o X4, VS stable, 4-5L HFNC. Family updated and aware. Pt transferred from ICU to 243

## 2022-03-15 NOTE — Progress Notes (Addendum)
[place] NOTE       Patient ID: Willie Santos MRN: [id number] DOB/AGE: 1960/12/16 61 y.o.  Admit date: 03/13/2022 Referring Physician Dr. Jacqualine Code Primary Physician [facility] Primary Cardiologist Dr. Hunt Oris [facility])  Reason for Consultation STEMI  HPI: Willie Santos is a 65RFF with a PMH of CAD s/p x4 to the mid and distal LAD in 2014 and 2015, HFrEF (30-35%, G2 DD 08/2020) HTN, OSA, ongoing tobacco use, alcohol abuse, DM2 (A1c 6.8%), obesity, psoriatic arthritis on methotrexate who presented to [facility] via EMS on 03/13/2022 as a code STEMI.  EKG concerning for inferior STE.  He was taken urgently to the Cath Lab where he underwent successful PCI with DES to the RCA by Dr. Lujean Amel, and Aggrastat infusion post procedurally.  Overnight, he was having issues with delirium/agitation and desaturating on room air, improved with HFNC.  He has been chest pain-free since his LHC.  Interval History:  -Says he feels better today, continues to deny chest pain.  No shortness of breath while on HFNC.  BNP minimally elevated at 177, suspect more pulmonary/OSA involvement rather than heart failure symptoms. -His peripheral edema is about at baseline -Motivated to quit smoking and drinking -Echo resulted with EF 35-40%, global hypo-, mild to moderate LV dilation, moderate MR, mild AS  Review of systems complete and found to be negative unless listed above     No past medical history on file.    Medications Prior to Admission  Medication Sig Dispense Refill Last Dose   Adalimumab (HUMIRA PEN) 40 MG/0.4ML PNKT Inject 1 Dose into the skin once a week.   Past Week   albuterol (PROVENTIL) (2.5 MG/3ML) 0.083% nebulizer solution Take 2.5 mg by nebulization every 4 (four) hours as needed.   prn at prn   albuterol (VENTOLIN HFA) 108 (90 Base) MCG/ACT inhaler Inhale 1-2 puffs into the lungs every 4 (four) hours as needed.   prn at prn   aspirin EC 81 MG tablet Take 1 tablet by  mouth daily.   Past Week   atorvastatin (LIPITOR) 40 MG tablet Take 1 tablet by mouth daily.   Past Week   Fluticasone-Umeclidin-Vilant (TRELEGY ELLIPTA) 100-62.5-25 MCG/ACT AEPB Inhale 1 puff into the lungs daily.   Past Week   folic acid (FOLVITE) 1 MG tablet Take 1 tablet by mouth daily.   Past Week   gabapentin (NEURONTIN) 300 MG capsule Take 600 mg by mouth at bedtime.   Past Week   ipratropium-albuterol (DUONEB) 0.5-2.5 (3) MG/3ML SOLN Inhale 3 mLs into the lungs every 6 (six) hours as needed.   Past Month   losartan (COZAAR) 100 MG tablet Take 100 mg by mouth daily.   Past Week   methotrexate (RHEUMATREX) 2.5 MG tablet Take 20 mg by mouth once a week. Caution:Chemotherapy. Protect from light.   Past Week   metoprolol tartrate (LOPRESSOR) 25 MG tablet Take 25 mg by mouth 2 (two) times daily.   Past Week   oxyCODONE (OXY IR/ROXICODONE) 5 MG immediate release tablet Take 5 mg by mouth 4 (four) times daily as needed.   Past Week   spironolactone (ALDACTONE) 25 MG tablet Take 25 mg by mouth daily.   Past Week   Social History   Socioeconomic History   Marital status: Single    Spouse name: Not on file   Number of children: Not on file   Years of education: Not on file   Highest education level: Not on file  Occupational  History   Not on file  Tobacco Use   Smoking status: Not on file   Smokeless tobacco: Not on file  Substance and Sexual Activity   Alcohol use: Not on file   Drug use: Not on file   Sexual activity: Not on file  Other Topics Concern   Not on file  Social History Narrative   Not on file   Social Determinants of Health   Financial Resource Strain: Not on file  Food Insecurity: Not on file  Transportation Needs: Not on file  Physical Activity: Not on file  Stress: Not on file  Social Connections: Not on file  Intimate Partner Violence: Not on file    No family history on file.   Vitals:   03/15/22 0757 03/15/22 0800 03/15/22 0900 03/15/22 1000  BP:  (!)  131/93 (!) 132/91 123/83  Pulse:  100 96 88  Resp:  _0 Temp:  98 F (36.7 C)    TempSrc:      SpO2: 100% 100% 96% 99%  Weight:      Height:        PHYSICAL EXAM General: Middle-aged obese Caucasian male, well nourished, in no acute distress.  Sitting upright in recliner, son asleep in his hospital bed. HEENT:  Normocephalic and atraumatic. Neck:  No JVD.  Lungs: Slight conversational dyspnea on HFNC, able to speak in short phrases.  Decreased breath sounds without appreciable crackles or wheezes.   Heart: HRRR . Normal S1 and S2 without gallops or murmurs.  Abdomen: Non-distended appearing with excess adiposity.  Msk: Normal strength and tone for age. Extremities: Warm and well perfused. No clubbing, cyanosis.  Trace bilateral lower extremity edema.  Neuro: Alert and oriented X 3. Psych:  Answers questions appropriately.   Labs: Basic Metabolic Panel: Recent Labs    03/14/22 0926 03/15/22 0518  NA 140 140  K 5.0 4.7  CL 103 103  CO2 26 33*  GLUCOSE 195* 179*  BUN 26* 24*  CREATININE 0.92 0.55*  CALCIUM 9.6 8.8*  MG  --  2.2  PHOS  --  3.4   Liver Function Tests: Recent Labs    03/13/22 2155  AST 32  ALT 23  ALKPHOS 48  BILITOT 0.7  PROT 7.9  ALBUMIN 4.0   No results for input(s): "LIPASE", "AMYLASE" in the last 72 hours. CBC: Recent Labs    03/13/22 2155 03/14/22 0926 03/15/22 0518  WBC 10.2 9.4 14.8*  NEUTROABS 7.0  --   --   HGB 14.6 16.1 13.9  HCT 44.1 49.1 41.9  MCV 95.5 96.1 96.3  PLT 261 299 266   Cardiac Enzymes: Recent Labs    03/13/22 2155 03/14/22 0103  TROPONINIHS 199* >24,000*   BNP: Recent Labs    03/14/22 0926  BNP 177.7*   D-Dimer: No results for input(s): "DDIMER" in the last 72 hours. Hemoglobin A1C: Recent Labs    03/13/22 2155  HGBA1C 6.8*   Fasting Lipid Panel: Recent Labs    03/13/22 2155  CHOL 144  HDL 43  LDLCALC 56  TRIG 224*  CHOLHDL 3.3   Thyroid Function Tests: No results for input(s):  "TSH", "T4TOTAL", "T3FREE", "THYROIDAB" in the last 72 hours.  Invalid input(s): "FREET3" Anemia Panel: No results for input(s): "VITAMINB12", "FOLATE", "FERRITIN", "TIBC", "IRON", "RETICCTPCT" in the last 72 hours.   Radiology: CARDIAC CATHETERIZATION  Result Date: 03/14/2022   Mid RCA to Dist RCA lesion is 100% stenosed.   Mid Cx lesion is  50% stenosed.   Previously placed Ost LAD to Mid LAD stent of unknown type is  widely patent.   A drug-eluting stent was successfully placed using a STENT ONYX FRONTIER 3.0X18.   Post intervention, there is a 0% residual stenosis.   There is mild left ventricular systolic dysfunction.   LV end diastolic pressure is moderately elevated.   The left ventricular ejection fraction is 50-55% by visual estimate. Conclusion Status post emergent cardiac catheter because of STEMI Patient had relatively preserved left ventricular function EF around 50% left ventricular enlargement Coronaries Unremarkable left main LAD circumflex no significant disease Widely patent proximal to mid LAD stent moderate disease in circumflex all with TIMI-3 flow RCA Large 100% mid to distal IRA TIMI 0 flow Intervention Successful PCI and stent to mid to distal RCA with DES 3.0 x 18 mm [place] Onyx to 13 atm Lesion reduced from 100 down to 0% TIMI 0 flow back to TIMI-3 flow Procedure done radially TR band applied Patient maintained on aspirin and Brilinta statin beta-blocker ACE Aggrastat for 18 hours Patient tolerated procedure well No complications   DG Chest Port 1 View  Result Date: 03/14/2022 CLINICAL DATA:  Evaluate for aspiration pneumonia. EXAM: PORTABLE CHEST 1 VIEW COMPARISON:  03/13/2022 FINDINGS: Stable cardiomediastinal contours. No signs of pleural effusion. No airspace opacities identified. Unchanged mild diffuse increase interstitial markings. Visualized osseous structures appear intact. IMPRESSION: Unchanged mild diffuse increase interstitial markings. No airspace opacities  identified. Electronically Signed   By: Kerby Moors M.D.   On: 03/14/2022 15:06   DG Chest Port 1 View  Result Date: 03/13/2022 CLINICAL DATA:  Chest pain; STEMI suspected EXAM: PORTABLE CHEST 1 VIEW COMPARISON:  radiographs 05/24/2008 FINDINGS: Defibrillator pad over the left chest. Mild cardiomegaly. Bilateral interstitial coarsening. No focal consolidation, pleural effusion, or pneumothorax. No acute osseous abnormality. IMPRESSION: Bilateral interstitial coarsening may be due to COPD though superimposed edema could be considered. Cardiomegaly. Electronically Signed   By: Placido Sou M.D.   On: 03/13/2022 22:11    ECHO 35-40%, global hypo-, mild to moderate LV dilation, moderate MR, mild AS  TELEMETRY reviewed by me (LT) 03/15/2022 : Sinus rhythm 70s to 90s  Data reviewed by me (LT) 03/15/2022: Hospitalist progress note, last cardiology note from 01/2021 CBC BMP vitals telemetry eyes nose BNP  Principal Problem:   STEMI involving right coronary artery (HCC) Active Problems:   Acute ST elevation myocardial infarction (STEMI) involving other coronary artery of inferior wall (HCC)   Dyslipidemia   Essential hypertension   Chronic obstructive pulmonary disease (COPD) (HCC)   Psoriatic arthritis (HCC)   Obstructive sleep apnea   Chronic HFrEF (heart failure with reduced ejection fraction) (HCC)   NSVT (nonsustained ventricular tachycardia) (HCC)   Acute hypoxemic respiratory failure (HCC)   Alcohol withdrawal delirium (HCC)   Obesity, Class III, BMI 40-49.9 (morbid obesity) ([place])    ASSESSMENT AND PLAN:  Willie Santos is a 89yoM with a PMH of CAD s/p PCI x4 to the mid and distal LAD in 2014 and 2015, HFrEF (30-35%, G2 DD 08/2020) HTN, OSA, ongoing tobacco use, alcohol abuse, DM2 (A1c 6.8%), obesity who presented to [facility] via EMS on 03/13/2022 as a code STEMI.  EKG concerning for inferior STE.  He was taken urgently to the Cath Lab where he underwent successful PCI with DES to the  RCA by Dr. Lujean Amel, and Aggrastat infusion post procedurally.  Overnight, he was having issues with delirium/agitation and desaturating on room air, improved with  HFNC.  He has been chest pain-free since his LHC.  #Inferior STEMI #CAD s/p PCI x4 mid and distal LAD 2014, 2015 He received DES x1 to the RCA on 11/5 and Aggrastat infusion post procedurally.  He has been chest pain-free since.  Per review of records, he was last seen by his cardiologist at [facility] 01/2021 his GDMT was escalated, and he was referred to a pulmonologist (unclear if he ever saw them). -Continue DAPT with aspirin and Brilinta uninterrupted for 1 year -Continue atorvastatin 80 mg daily -Continue losartan 100 mg daily,  -Continue metoprolol 50 mg twice daily -Continue spironolactone 25 mg once a day -Will discuss GLP-1/SGLT2 with him further -He is okay to move from stepdown to the floor today from a cardiac standpoint -Needs cardiac rehab at discharge -He prefers to continue his cardiology care at [facility] at discharge, last seen by Dr. Hunt Oris.  #Chronic HFrEF #Mild AS, moderate MR Repeat echo this admission shows similar EF compared to prior last year at 35-40% with moderately reduced LVEF and global hypokinesis.  Suspect ischemic in nature, worsened by ongoing tobacco and alcohol use -Continue GDMT as above.  He is very short of breath requiring HFNC, BNP is minimally elevated at 177, with trace peripheral edema he says is at his baseline.  Suspect more pulmonary component to see looks relatively euvolemic. -We will need surveillance of his AS and MR routinely  #Type 2 diabetes A1c 6.8%, would benefit from GLP-1/SGLT2i as above.   #COPD, ongoing tobacco use #Suspected OSA -Needs a sleep study on outpatient basis.  Discussed smoking cessation with him in detail, he is motivated to quit.  #Alcohol abuse -Continue CIWA while inpatient -Discussed complete cessation from alcohol  This patient's plan of care was  discussed and created with Dr. Saralyn Pilar and he is in agreement.  Signed: Tristan Schroeder , PA-C 03/15/2022, 10:33 AM [facility]

## 2022-03-15 NOTE — Progress Notes (Signed)
Progress Note   Patient: ARLAN BIRKS VFI:[id number] DOB: 05-09-1961 DOA: 03/13/2022     2 DOS: the patient was seen and examined on 03/15/2022   Brief hospital course: ABDULLA POOLEY is a 61 y.o. obese Caucasian male with medical history significant for Coronary artery disease, combined systolic and diastolic CHF essential hypertension, emphysema, OSA, psoriatic arthritis, depression, dyslipidemia and history of tobacco and alcohol abuse, who presented to the emergency room with acute onset of chest pain.  Peak troponin was more than 24,000.  EKG showed ST elevation.  Patient with emergency brought to the Cath Lab, drug-eluting eluding stent was placed in RCA, patient started on dual antiplatelet treatment. Overnight over the procedure, patient developed significant agitation and confusion.  He was also placed on BiPAP for hypoxemia.  Placed on IV steroids for possible COPD exacerbation. Patient is more stable on 11/7, back on 4 L oxygen.  Transfer out of ICU.   Assessment and Plan: Acute hypoxemic respiratory failure. COPD exacerbation. Obstructive sleep apnea. Developed significant respiratory distress as well as hypoxemia 11/5 night in the setting of altered mental status and agitation.  Reviewed patient chest x-ray, has some bilateral lower lobe atelectasis. Does not seem to have acute on chronic congestive heart failure, no evidence of aspiration pneumonia.  I suspect patient has significant COPD exacerbation and sleep apnea.  Patient also had a significant agitation due to alcohol withdrawal. Condition had improved today, he was on 4 L oxygen last night.  Continued IV steroids. Patient did not wear any BiPAP last night.  However, he may benefit from outpatient sleep study after discharge.  STEMI secondary to obstructed RCA. Nonsustained ventricular tachycardia. Patient is status post RCA drug-eluting stent.  Continue dual antiplatelets as well as statin. Patient had  nonsustained ventricular tachycardia of 8 beats, most likely this is secondary to reperfusion arrhythmia.   Patient condition stable now.  No recurrent VT.  Patient be transferred to cardiac unit today.   Delirium tremens with alcohol abuse. Patient mental status much better today, he was restless last night, but no significant sedation.  Continue CIWA protocol.  Chronic systolic congestive heart failure. Sill pending echocardiogram.  Does not seem to have any volume overload.  Morbid obesity. Diet exercise.   Psoriasis. Psoriatic arthritis. Patient has chronic skin rashes and psoriatic arthritis.  Currently on methotrexate and Humira.      Subjective:  Patient doing much better today.  He did not have additional agitation last night.  He is on 4 L oxygen today, has shortness of breath with exertion.  Physical Exam: Vitals:   03/15/22 1100 03/15/22 1200 03/15/22 1216 03/15/22 1300  BP: 103/65  112/79 (!) 129/92  Pulse: 88 98 95 78  Resp: 20 (!) 25 (!) 23 13  Temp:      TempSrc:      SpO2: 94% 99% 96% 95%  Weight:      Height:       General exam: Appears calm and comfortable, obese obese. Respiratory system: Decreased breath sounds without crackles. Respiratory effort normal. Cardiovascular system: S1 & S2 heard, RRR. No JVD, murmurs, rubs, gallops or clicks. No pedal edema. Gastrointestinal system: Abdomen is nondistended, soft and nontender. No organomegaly or masses felt. Normal bowel sounds heard. Central nervous system: Alert and oriented. No focal neurological deficits. Extremities: Symmetric 5 x 5 power. Skin: No rashes, lesions or ulcers Psychiatry: Mood & affect appropriate.   Data Reviewed:  Lab results reviewed.  Family Communication: Son updated at bedside.  Disposition: Status is: Inpatient Remains inpatient appropriate because: Severity of disease, need close monitoring for acute hypoxemic respite failure.  Planned Discharge Destination: Home with Home  Health    Time spent: 55 minutes  Author: Sharen Hones, MD 03/15/2022 1:13 PM  For on call review [email or web address].

## 2022-03-16 ENCOUNTER — Other Ambulatory Visit (HOSPITAL_COMMUNITY): Payer: Self-pay

## 2022-03-16 ENCOUNTER — Telehealth (HOSPITAL_COMMUNITY): Payer: Self-pay | Admitting: Pharmacy Technician

## 2022-03-16 DIAGNOSIS — I2111 ST elevation (STEMI) myocardial infarction involving right coronary artery: Secondary | ICD-10-CM | POA: Diagnosis not present

## 2022-03-16 LAB — GLUCOSE, CAPILLARY
Glucose-Capillary: 170 mg/dL — ABNORMAL HIGH (ref 70–99)
Glucose-Capillary: 185 mg/dL — ABNORMAL HIGH (ref 70–99)

## 2022-03-16 LAB — BASIC METABOLIC PANEL WITH GFR
Anion gap: 8 (ref 5–15)
BUN: 24 mg/dL — ABNORMAL HIGH (ref 8–23)
CO2: 33 mmol/L — ABNORMAL HIGH (ref 22–32)
Calcium: 9.1 mg/dL (ref 8.9–10.3)
Chloride: 98 mmol/L (ref 98–111)
Creatinine, Ser: 0.71 mg/dL (ref 0.61–1.24)
GFR, Estimated: >60 mL/min (ref >60)
Glucose, Bld: 206 mg/dL — ABNORMAL HIGH (ref 70–99)
Potassium: 4.6 mmol/L (ref 3.5–5.1)
Sodium: 139 mmol/L (ref 135–145)

## 2022-03-16 LAB — PHOSPHORUS: Phosphorus: 2.8 mg/dL (ref 2.5–4.6)

## 2022-03-16 LAB — MAGNESIUM: Magnesium: 2.2 mg/dL (ref 1.7–2.4)

## 2022-03-16 MED ORDER — PREDNISONE 20 MG PO TABS
40.0000 mg | ORAL_TABLET | Freq: Every day | ORAL | Status: DC
  Administered 2022-03-17: 40 mg via ORAL
  Filled 2022-03-16: qty 2

## 2022-03-16 MED ORDER — LIVING WELL WITH DIABETES BOOK
Freq: Once | Status: AC
  Filled 2022-03-16: qty 1

## 2022-03-16 MED ORDER — EMPAGLIFLOZIN 10 MG PO TABS
10.0000 mg | ORAL_TABLET | Freq: Every day | ORAL | Status: DC
  Administered 2022-03-17 – 2022-03-18 (×2): 10 mg via ORAL
  Filled 2022-03-16 (×2): qty 1

## 2022-03-16 MED ORDER — INSULIN ASPART 100 UNIT/ML IJ SOLN
0.0000 [IU] | Freq: Three times a day (TID) | INTRAMUSCULAR | Status: DC
  Administered 2022-03-16 – 2022-03-17 (×2): 2 [IU] via SUBCUTANEOUS
  Administered 2022-03-17: 5 [IU] via SUBCUTANEOUS
  Administered 2022-03-17 – 2022-03-18 (×2): 2 [IU] via SUBCUTANEOUS
  Filled 2022-03-16 (×5): qty 1

## 2022-03-16 MED ORDER — DAPAGLIFLOZIN PROPANEDIOL 10 MG PO TABS
10.0000 mg | ORAL_TABLET | Freq: Every day | ORAL | Status: DC
  Administered 2022-03-16: 10 mg via ORAL
  Filled 2022-03-16: qty 1

## 2022-03-16 MED ORDER — UMECLIDINIUM BROMIDE 62.5 MCG/ACT IN AEPB
1.0000 | INHALATION_SPRAY | Freq: Every day | RESPIRATORY_TRACT | Status: DC
  Administered 2022-03-16 – 2022-03-18 (×3): 1 via RESPIRATORY_TRACT
  Filled 2022-03-16: qty 7

## 2022-03-16 MED ORDER — FLUTICASONE FUROATE-VILANTEROL 100-25 MCG/ACT IN AEPB
1.0000 | INHALATION_SPRAY | Freq: Every day | RESPIRATORY_TRACT | Status: DC
  Administered 2022-03-16 – 2022-03-18 (×3): 1 via RESPIRATORY_TRACT
  Filled 2022-03-16: qty 28

## 2022-03-16 NOTE — Care Management Important Message (Signed)
Important Message  Patient Details  Name: Willie Santos MRN: [id number] Date of Birth: [birth year]   Medicare Important Message Given:  N/A - LOS <3 / Initial given by admissions     Johnell Comings 03/16/2022, 9:26 AM

## 2022-03-16 NOTE — Progress Notes (Signed)
Patient's O2 saturation: > at rest on 3L [place] = 96% > ambulation on 3L [place] = 95% > ambulation on room air = 91% Patient ambulated in the hallway.  Noted to be moderately short of breath on exertion.  Pt did not require to stop during ambulation.  Assisted back to chair after completing activity, kept on room air.  Tolerated activity well.  No distress at this time.

## 2022-03-16 NOTE — TOC Benefit Eligibility Note (Addendum)
Patient Product/process development scientist completed.    The patient is currently admitted and upon discharge could be taking Brilinta 90 mg  The current 30 day co-pay is $45.00.   The patient is currently admitted and upon discharge could be taking Jardiance 10 mg  The current 30 day co-pay is $45.00.   The patient is insured through [employer] Medicare Part D     Roland Earl, CPhT Pharmacy Patient Advocate Specialist [facility] Patient Advocate Team Direct Number: [phone]  Fax: [phone]

## 2022-03-16 NOTE — Telephone Encounter (Signed)
Pharmacy Patient Advocate Encounter  Insurance verification completed.    The patient is insured through [employer] Part D   The patient is currently admitted and ran test claims for the following: Farxiga, Jardiance, Brilinta.  Copays and coinsurance results were relayed to Inpatient clinical team.

## 2022-03-16 NOTE — Progress Notes (Signed)
Pt called from bathroom, pt noted blood from his right groin.  Checked pt and noted hematoma and blood oozing from right lower quadrant, which appears like from the lovenox injection site from this morning.  Hematoma measures 4.5 in x 7 in.  Assisted back in bed, pressure applied to bleeding site.  Pressure dressing applied.  Dr. Dayna Barker made aware.

## 2022-03-16 NOTE — Progress Notes (Signed)
[place] NOTE       Patient ID: BLU LORI MRN: [id number] DOB/AGE: 06-20-1960 61 y.o.  Admit date: 03/13/2022 Referring Physician Dr. Jacqualine Code Primary Physician [facility] Primary Cardiologist Dr. Hunt Oris [facility])  Reason for Consultation STEMI  HPI: DINNIS ROG is a 61HWE with a PMH of CAD s/p x4 to the mid and distal LAD in 2014 and 2015, HFrEF (30-35%, G2 DD 08/2020) HTN, OSA, ongoing tobacco use, alcohol abuse, DM2 (A1c 6.8%), obesity, psoriatic arthritis on methotrexate who presented to [facility] via EMS on 03/13/2022 as a code STEMI.  EKG concerning for inferior STE.  He was taken urgently to the Cath Lab where he underwent successful PCI with DES to the RCA by Dr. Lujean Amel, and Aggrastat infusion post procedurally.  Overnight on 11/6, he was having issues with delirium/agitation and desaturating on room air, improved with HFNC.  He has been chest pain-free since his LHC.  Interval History:  -feels good today, seen in the PCU. Continues to deny chest pain. When I saw him this morning, [place] was not in place. He should be on 3L. Feels somewhat short of breath. No peripheral edema.   Review of systems complete and found to be negative unless listed above     No past medical history on file.    Medications Prior to Admission  Medication Sig Dispense Refill Last Dose   Adalimumab (HUMIRA PEN) 40 MG/0.4ML PNKT Inject 1 Dose into the skin once a week.   Past Week   albuterol (PROVENTIL) (2.5 MG/3ML) 0.083% nebulizer solution Take 2.5 mg by nebulization every 4 (four) hours as needed.   prn at prn   albuterol (VENTOLIN HFA) 108 (90 Base) MCG/ACT inhaler Inhale 1-2 puffs into the lungs every 4 (four) hours as needed.   prn at prn   aspirin EC 81 MG tablet Take 1 tablet by mouth daily.   Past Week   atorvastatin (LIPITOR) 40 MG tablet Take 1 tablet by mouth daily.   Past Week   Fluticasone-Umeclidin-Vilant (TRELEGY ELLIPTA) 100-62.5-25 MCG/ACT AEPB Inhale 1  puff into the lungs daily.   Past Week   folic acid (FOLVITE) 1 MG tablet Take 1 tablet by mouth daily.   Past Week   gabapentin (NEURONTIN) 300 MG capsule Take 600 mg by mouth at bedtime.   Past Week   ipratropium-albuterol (DUONEB) 0.5-2.5 (3) MG/3ML SOLN Inhale 3 mLs into the lungs every 6 (six) hours as needed.   Past Month   losartan (COZAAR) 100 MG tablet Take 100 mg by mouth daily.   Past Week   methotrexate (RHEUMATREX) 2.5 MG tablet Take 20 mg by mouth once a week. Caution:Chemotherapy. Protect from light.   Past Week   metoprolol tartrate (LOPRESSOR) 25 MG tablet Take 25 mg by mouth 2 (two) times daily.   Past Week   oxyCODONE (OXY IR/ROXICODONE) 5 MG immediate release tablet Take 5 mg by mouth 4 (four) times daily as needed.   Past Week   spironolactone (ALDACTONE) 25 MG tablet Take 25 mg by mouth daily.   Past Week   Social History   Socioeconomic History   Marital status: Single    Spouse name: Not on file   Number of children: Not on file   Years of education: Not on file   Highest education level: Not on file  Occupational History   Not on file  Tobacco Use   Smoking status: Not on file   Smokeless tobacco: Not on file  Substance and Sexual Activity   Alcohol use: Not on file   Drug use: Not on file   Sexual activity: Not on file  Other Topics Concern   Not on file  Social History Narrative   Not on file   Social Determinants of Health   Financial Resource Strain: Not on file  Food Insecurity: Not on file  Transportation Needs: Not on file  Physical Activity: Not on file  Stress: Not on file  Social Connections: Not on file  Intimate Partner Violence: Not on file    No family history on file.   Vitals:   03/16/22 0031 03/16/22 0316 03/16/22 0802 03/16/22 0818  BP: 123/82 120/71  135/77  Pulse: 85 95 98 92  Resp: _0 Temp: 97.8 F (36.6 C) 97.8 F (36.6 C)  98.7 F (37.1 C)  TempSrc: Oral Oral    SpO2: 98% 97% 95% 98%  Weight:       Height:        PHYSICAL EXAM General: Middle-aged obese Caucasian male, well nourished, in no acute distress.  Sitting upright in recliner, son asleep in his hospital bed. HEENT:  Normocephalic and atraumatic. Neck:  No JVD.  Lungs: Slight conversational dyspnea on room air, able to speak in short phrases.  Decreased breath sounds without appreciable crackles or wheezes.   Heart: HRRR . Normal S1 and S2 without gallops or murmurs.  Abdomen: Non-distended appearing with excess adiposity.  Msk: Normal strength and tone for age. Extremities: Warm and well perfused. No clubbing, cyanosis.  Trace bilateral lower extremity edema. Scattered erythematous macules on lower legs c/w psoriatic arthritis Neuro: Alert and oriented X 3. Psych:  Answers questions appropriately.   Labs: Basic Metabolic Panel: Recent Labs    03/15/22 0518 03/16/22 0518  NA 140 139  K 4.7 4.6  CL 103 98  CO2 33* 33*  GLUCOSE 179* 206*  BUN 24* 24*  CREATININE 0.55* 0.71  CALCIUM 8.8* 9.1  MG 2.2 2.2  PHOS 3.4 2.8    Liver Function Tests: Recent Labs    03/13/22 2155  AST 32  ALT 23  ALKPHOS 48  BILITOT 0.7  PROT 7.9  ALBUMIN 4.0    No results for input(s): "LIPASE", "AMYLASE" in the last 72 hours. CBC: Recent Labs    03/13/22 2155 03/14/22 0926 03/15/22 0518  WBC 10.2 9.4 14.8*  NEUTROABS 7.0  --   --   HGB 14.6 16.1 13.9  HCT 44.1 49.1 41.9  MCV 95.5 96.1 96.3  PLT 261 299 266    Cardiac Enzymes: Recent Labs    03/13/22 2155 03/14/22 0103  TROPONINIHS 199* >24,000*    BNP: Recent Labs    03/14/22 0926  BNP 177.7*    D-Dimer: No results for input(s): "DDIMER" in the last 72 hours. Hemoglobin A1C: Recent Labs    03/13/22 2155  HGBA1C 6.8*    Fasting Lipid Panel: Recent Labs    03/13/22 2155  CHOL 144  HDL 43  LDLCALC 56  TRIG 224*  CHOLHDL 3.3    Thyroid Function Tests: No results for input(s): "TSH", "T4TOTAL", "T3FREE", "THYROIDAB" in the last 72  hours.  Invalid input(s): "FREET3" Anemia Panel: No results for input(s): "VITAMINB12", "FOLATE", "FERRITIN", "TIBC", "IRON", "RETICCTPCT" in the last 72 hours.   Radiology: ECHOCARDIOGRAM COMPLETE  Result Date: 03/15/2022    ECHOCARDIOGRAM REPORT   Patient Name:   ADRION MENZ Date of Exam: 03/14/2022 Medical Rec #:  [id number]  Height:       72.0 in Accession #:    [id number]          Weight:       308.0 lb Date of Birth:  Oct 20, 1960            BSA:          2.560 m Patient Age:    27 years            BP:           104/69 mmHg Patient Gender: M                   HR:           86 bpm. Exam Location:  [facility] Procedure: 2D Echo, Cardiac Doppler, Color Doppler and Intracardiac            Opacification Agent Indications:     Acute Myocardial Infarction, Unspecified I21.9  History:         Patient has no prior history of Echocardiogram examinations.  Sonographer:     Mikki Santee RDCS Referring Phys:  [id number] [place] Breklyn Fabrizio Diagnosing Phys: Isaias Cowman MD IMPRESSIONS  1. Left ventricular ejection fraction, by estimation, is 35 to 40%. The left ventricle has moderately decreased function. The left ventricle demonstrates global hypokinesis. The left ventricular internal cavity size was mildly to moderately dilated. Left ventricular diastolic parameters were normal.  2. Right ventricular systolic function is normal. The right ventricular size is normal.  3. The mitral valve is normal in structure. Moderate mitral valve regurgitation. No evidence of mitral stenosis.  4. The aortic valve is normal in structure. Aortic valve regurgitation is not visualized. Mild aortic valve stenosis.  5. The inferior vena cava is normal in size with greater than 50% respiratory variability, suggesting right atrial pressure of 3 mmHg. FINDINGS  Left Ventricle: Left ventricular ejection fraction, by estimation, is 35 to 40%. The left ventricle has moderately decreased function. The left ventricle  demonstrates global hypokinesis. Definity contrast agent was given IV to delineate the left ventricular endocardial borders. The left ventricular internal cavity size was mildly to moderately dilated. There is no left ventricular hypertrophy. Left ventricular diastolic parameters were normal. Right Ventricle: The right ventricular size is normal. No increase in right ventricular wall thickness. Right ventricular systolic function is normal. Left Atrium: Left atrial size was normal in size. Right Atrium: Right atrial size was normal in size. Pericardium: There is no evidence of pericardial effusion. Mitral Valve: The mitral valve is normal in structure. Moderate mitral valve regurgitation. No evidence of mitral valve stenosis. Tricuspid Valve: The tricuspid valve is normal in structure. Tricuspid valve regurgitation is not demonstrated. No evidence of tricuspid stenosis. Aortic Valve: The aortic valve is normal in structure. Aortic valve regurgitation is not visualized. Mild aortic stenosis is present. Aortic valve mean gradient measures 12.0 mmHg. Aortic valve peak gradient measures 21.2 mmHg. Aortic valve area, by VTI measures 1.87 cm. Pulmonic Valve: The pulmonic valve was normal in structure. Pulmonic valve regurgitation is not visualized. No evidence of pulmonic stenosis. Aorta: The aortic root is normal in size and structure. Venous: The inferior vena cava is normal in size with greater than 50% respiratory variability, suggesting right atrial pressure of 3 mmHg. IAS/Shunts: No atrial level shunt detected by color flow Doppler.  LEFT VENTRICLE PLAX 2D LVIDd:         7.10 cm   Diastology LVIDs:  5.80 cm   LV e' medial:    5.92 cm/s LV PW:         1.20 cm   LV E/e' medial:  16.2 LV IVS:        0.90 cm   LV e' lateral:   11.70 cm/s LVOT diam:     2.70 cm   LV E/e' lateral: 8.2 LV SV:         77 LV SV Index:   30 LVOT Area:     5.73 cm  RIGHT VENTRICLE RV S prime:     15.10 cm/s TAPSE (M-mode): 2.9 cm LEFT  ATRIUM              Index LA diam:        4.90 cm  1.91 cm/m LA Vol (A2C):   103.0 ml 40.24 ml/m LA Vol (A4C):   65.8 ml  25.71 ml/m LA Biplane Vol: 86.5 ml  33.79 ml/m  AORTIC VALVE AV Area (Vmax):    1.85 cm AV Area (Vmean):   1.77 cm AV Area (VTI):     1.87 cm AV Vmax:           230.00 cm/s AV Vmean:          161.000 cm/s AV VTI:            0.411 m AV Peak Grad:      21.2 mmHg AV Mean Grad:      12.0 mmHg LVOT Vmax:         74.40 cm/s LVOT Vmean:        49.800 cm/s LVOT VTI:          0.134 m LVOT/AV VTI ratio: 0.33  AORTA Ao Root diam: 3.40 cm MITRAL VALVE MV Area (PHT): 3.99 cm       SHUNTS MV Decel Time: 190 msec       Systemic VTI:  0.13 m MR Peak grad:    100.4 mmHg   Systemic Diam: 2.70 cm MR Mean grad:    60.0 mmHg MR Vmax:         501.00 cm/s MR Vmean:        366.0 cm/s MR PISA:         4.02 cm MR PISA Eff ROA: 30 mm MR PISA Radius:  0.80 cm MV E velocity: 95.90 cm/s MV A velocity: 86.50 cm/s MV E/A ratio:  1.11 Isaias Cowman MD Electronically signed by Isaias Cowman MD Signature Date/Time: 03/15/2022/1:24:09 PM    Final    CARDIAC CATHETERIZATION  Result Date: 03/14/2022   Mid RCA to Dist RCA lesion is 100% stenosed.   Mid Cx lesion is 50% stenosed.   Previously placed Ost LAD to Mid LAD stent of unknown type is  widely patent.   A drug-eluting stent was successfully placed using a STENT ONYX FRONTIER [id number].   Post intervention, there is a 0% residual stenosis.   There is mild left ventricular systolic dysfunction.   LV end diastolic pressure is moderately elevated.   The left ventricular ejection fraction is 50-55% by visual estimate. Conclusion Status post emergent cardiac catheter because of STEMI Patient had relatively preserved left ventricular function EF around 50% left ventricular enlargement Coronaries Unremarkable left main LAD circumflex no significant disease Widely patent proximal to mid LAD stent moderate disease in circumflex all with TIMI-3 flow RCA Large 100% mid  to distal IRA TIMI 0 flow Intervention Successful PCI and stent to mid to distal RCA with DES 3.0  x 18 mm [place] Onyx to 13 atm Lesion reduced from 100 down to 0% TIMI 0 flow back to TIMI-3 flow Procedure done radially TR band applied Patient maintained on aspirin and Brilinta statin beta-blocker ACE Aggrastat for 18 hours Patient tolerated procedure well No complications   DG Chest Port 1 View  Result Date: 03/14/2022 CLINICAL DATA:  Evaluate for aspiration pneumonia. EXAM: PORTABLE CHEST 1 VIEW COMPARISON:  03/13/2022 FINDINGS: Stable cardiomediastinal contours. No signs of pleural effusion. No airspace opacities identified. Unchanged mild diffuse increase interstitial markings. Visualized osseous structures appear intact. IMPRESSION: Unchanged mild diffuse increase interstitial markings. No airspace opacities identified. Electronically Signed   By: Kerby Moors M.D.   On: 03/14/2022 15:06   DG Chest Port 1 View  Result Date: 03/13/2022 CLINICAL DATA:  Chest pain; STEMI suspected EXAM: PORTABLE CHEST 1 VIEW COMPARISON:  radiographs 05/24/2008 FINDINGS: Defibrillator pad over the left chest. Mild cardiomegaly. Bilateral interstitial coarsening. No focal consolidation, pleural effusion, or pneumothorax. No acute osseous abnormality. IMPRESSION: Bilateral interstitial coarsening may be due to COPD though superimposed edema could be considered. Cardiomegaly. Electronically Signed   By: Placido Sou M.D.   On: 03/13/2022 22:11    ECHO 35-40%, global hypo-, mild to moderate LV dilation, moderate MR, mild AS  TELEMETRY reviewed by me (LT) 03/16/2022 : Sinus rhythm 70s to 90s  Data reviewed by me (LT) 03/16/2022: Hospitalist progress note, last  CBC BMP vitals telemetry I&Os  Principal Problem:   STEMI involving right coronary artery (HCC) Active Problems:   Acute ST elevation myocardial infarction (STEMI) involving other coronary artery of inferior wall (HCC)   Dyslipidemia   Essential  hypertension   COPD with acute exacerbation (HCC)   Psoriatic arthritis ([place])   Obstructive sleep apnea   Chronic HFrEF (heart failure with reduced ejection fraction) (HCC)   NSVT (nonsustained ventricular tachycardia) (HCC)   Acute hypoxemic respiratory failure (HCC)   Alcohol withdrawal delirium (HCC)   Obesity, Class III, BMI 40-49.9 (morbid obesity) ([place])    ASSESSMENT AND PLAN:  DEKOTA SHENK is a 16yoM with a PMH of CAD s/p PCI x4 to the mid and distal LAD in 2014 and 2015, HFrEF (30-35%, G2 DD 08/2020) HTN, OSA, ongoing tobacco use, alcohol abuse, DM2 (A1c 6.8%), obesity who presented to [facility] via EMS on 03/13/2022 as a code STEMI.  EKG concerning for inferior STE.  He was taken urgently to the Cath Lab where he underwent successful PCI with DES to the RCA by Dr. Lujean Amel, and Aggrastat infusion post procedurally.  Overnight, he was having issues with delirium/agitation and desaturating on room air, improved with HFNC.  He has been chest pain-free since his LHC.  #Inferior STEMI #CAD s/p PCI x4 mid and distal LAD 2014, 2015 He received DES x1 to the RCA on 11/5 and Aggrastat infusion post procedurally.  He has been chest pain-free since.  Per review of records, he was last seen by his cardiologist at [facility] 01/2021 his GDMT was escalated, and he was referred to a pulmonologist (unclear if he ever saw them). -Continue DAPT with aspirin and Brilinta uninterrupted for 1 year -Continue atorvastatin 80 mg daily -Continue losartan 100 mg daily,  -Continue metoprolol 50 mg twice daily -Continue spironolactone 25 mg once a day -Start farxiga 80m today.  -ok for discharge from a cardiac standpoint today.  -Needs cardiac rehab at discharge -[facility] by Dr. RHunt Oris  #Chronic HFrEF #  Mild AS, moderate MR Repeat echo this admission shows similar EF compared to prior last year at 35-40% with moderately reduced LVEF and  global hypokinesis.  Suspect ischemic in nature, worsened by ongoing tobacco and alcohol use -Continue GDMT as above. BNP is minimally elevated at 177, with trace peripheral edema he says is at his baseline.  Suspect more pulmonary component to see looks relatively euvolemic. -wean O2 as tolerated per primary team -Will need surveillance of his AS and MR routinely  #Type 2 diabetes A1c 6.8%, would benefit from GLP-1/SGLT2i as above.   #COPD, ongoing tobacco use #Suspected OSA -Needs a sleep study on outpatient basis.  Discussed smoking cessation with him in detail, he is motivated to quit.  #Alcohol abuse -Continue CIWA while inpatient -Discussed complete cessation from alcohol  This patient's plan of care was discussed and created with Dr. Saralyn Pilar and he is in agreement.  Signed: Tristan Schroeder , PA-C 03/16/2022, 11:23 AM [facility] Card[facility]

## 2022-03-16 NOTE — Inpatient Diabetes Management (Signed)
Inpatient Diabetes Program Recommendations  AACE/ADA: New Consensus Statement on Inpatient Glycemic Control (2015)  Target Ranges:  Prepandial:   less than 140 mg/dL      Peak postprandial:   less than 180 mg/dL (1-2 hours)      Critically ill patients:  140 - 180 mg/dL   Lab Results  Component Value Date   GLUCAP 148 (H) 03/13/2022   HGBA1C 6.8 (H) 03/13/2022    Inpatient Diabetes Program Recommendations:   Noted A1c 6.8. Should address as new onset DM? Please consider while on steroids: -Glycemic control order set with 0-9 units tid, 0-5 units hs  Thank you, Darel Hong E. Caidon Foti, RN, MSN, CDE  Diabetes Coordinator Inpatient Glycemic Control Team Team Pager [phone] (8am-5pm) 03/16/2022 12:54 PM

## 2022-03-16 NOTE — Progress Notes (Signed)
PROGRESS NOTE OBERON HEHIR  PXT:[id number] DOB: 06/06/1960 DOA: 03/13/2022 PCP: Patient, No Pcp Per   Brief Narrative/Hospital Course: 61 y.o. obese Caucasian male with medical history significant for CAD, combined systolic and diastolic CHF essential hypertension, emphysema, OSA, psoriatic arthritis, depression, dyslipidemia and history of tobacco and alcohol abuse, who presented to the ED with acute onset of chest pain. In ED;  Peak troponin was more than 24,000.  EKG showed ST elevation. Patient with emergency brought to the Cath Lab, drug-eluting eluding stent was placed in RCA, patient started on dual antiplatelet treatment. Overnight over the procedure, patient developed significant agitation and confusion.  He was also placed on BiPAP for hypoxemia.  Placed on IV steroids for possible COPD exacerbation. 11/7, he is back on 4 L oxygen Transferred out of ICU.    Subjective: Seen and examined this morning.  On the bed side chair on 3 to nasal cannula overall much better no chest pain reports she used bathroom this morning and was short of breath which is baseline. Still has a Foley catheter in place wanting it to be removed 11/8: Afebrile overnight on 3 l HFNC, RR 18-20 heart rate in 90s Labs showed blood glucose 206 stable renal function   Assessment and Plan: Principal Problem:   STEMI involving right coronary artery (HCC) Active Problems:   Essential hypertension   Dyslipidemia   COPD with acute exacerbation (HCC)   Psoriatic arthritis (HCC)   Chronic HFrEF (heart failure with reduced ejection fraction) (HCC)   Acute ST elevation myocardial infarction (STEMI) involving other coronary artery of inferior wall (HCC)   Obstructive sleep apnea   NSVT (nonsustained ventricular tachycardia) (HCC)   Acute hypoxemic respiratory failure (HCC)   Alcohol withdrawal delirium (HCC)   Obesity, Class III, BMI 40-49.9 (morbid obesity) (HCC)  Acute hypoxic respiratory failure due to COPD  exacerbation: Acute COPD exacerbation: Patient had respiratory distress as well as hypoxemia 11/5 night in the setting of altered mental status and agitation.  Managed with BiPAP supplemental oxygen systemic steroids.  Overall respiratory status stable currently needing 3 to nasal cannula has baseline dyspnea on exertion with activity up to bathroom.  On Solu-Medrol 40 every 12 hours, start tapering for from morning, continue supplemental oxygen, bronchodilator/DuoNeb, start Dulera.  Has not needed BiPAP for at least 2 nights.  Sleep evaluation as outpatient.  Will likely need to go home on oxygen.  STEMI: s/p RCA DES stent placement on DAPT with statin, per cardiology.  NSVT: Stable, on metoprolol, on monitor. Chronic systolic CHF: EF with 35-40%, normal RV systolic function mitral valve aortic valve normal.  On metoprolol, losartan and Aldactone.  Per cardiology.  Monitor intake output Daily weight Delirium tremens with alcohol abuse: At this time improved stable.  Continue multivitamin, thiamine and folate.  Psoriasis w/ Psoriatic arthritis: With chronic skin rashes and psoriatic arthritis, on methotrexate and Humira. Delirium tremens with alcohol abuse: mentation improved.  On CIWA protocol-no sign of withdrawal now. New onset diabetes: A1c 6.8 seen by diabetes coordinator will keep on a sliding scale insulin, carbohydrate controlled diet Will need to follow-up with PCP.  Continue tapering steroid Recent Labs  Lab 03/13/22 2155 03/13/22 2325  GLUCAP  --  148*  HGBA1C 6.8*  --     Morbid Obesity:Patient's Body mass index is 41.77 kg/m. : Will benefit with PCP follow-up, weight loss  healthy lifestyle and outpatient sleep evaluation. Discontinue Foley catheter DVT prophylaxis: Lovenox Code Status:   Code Status: Full Code Family Communication:  plan of care discussed with patient at bedside. Patient status is: Inpatient because of respiratory failure Level of care: Telemetry Cardiac    Dispo: The patient is from: Home            Anticipated disposition: home w/ HH next 24 hours with supplemental oxygen  Objective: Vitals last 24 hrs: Vitals:   03/16/22 0316 03/16/22 0802 03/16/22 0818 03/16/22 1137  BP: 120/71  135/77 121/71  Pulse: 95 98 92 88  Resp: 20 18 20 20   Temp: 97.8 F (36.6 C)  98.7 F (37.1 C) 98.2 F (36.8 C)  TempSrc: Oral   Oral  SpO2: 97% 95% 98% 92%  Weight:      Height:       Weight change:   Physical Examination: General exam: alert awake, morbidly obese, older than stated age HEENT:Oral mucosa moist, Ear/Nose WNL grossly Respiratory system: bilaterally diminished BS, no use of accessory muscle Cardiovascular system: S1 & S2 +, No JVD. Gastrointestinal system: Abdomen soft,NT,ND, BS+ Nervous System:Alert, awake, moving extremities. Extremities: LE edema MILD,distal peripheral pulses palpable.  Skin: No rashes,no icterus. MSK: Normal muscle bulk,tone, power  Medications reviewed:  Scheduled Meds:  aspirin  81 mg Oral Daily   atorvastatin  80 mg Oral Daily   Chlorhexidine Gluconate Cloth  6 each Topical Daily   dapagliflozin propanediol  10 mg Oral Daily   enoxaparin (LOVENOX) injection  70 mg Subcutaneous Q24H   folic acid  1 mg Oral Daily   gabapentin  600 mg Oral QHS   insulin aspart  0-9 Units Subcutaneous TID WC   ipratropium-albuterol  3 mL Nebulization Q6H   losartan  100 mg Oral Daily   methotrexate  20 mg Oral Weekly   methylPREDNISolone (SOLU-MEDROL) injection  40 mg Intravenous Q12H   metoprolol tartrate  50 mg Oral BID   multivitamin with minerals  1 tablet Oral Daily   nicotine  21 mg Transdermal Daily   sodium chloride flush  3 mL Intravenous Q12H   spironolactone  25 mg Oral Daily   thiamine  100 mg Oral Daily   Or   thiamine  100 mg Intravenous Daily   ticagrelor  90 mg Oral BID   Continuous Infusions:  sodium chloride        Diet Order             Diet Carb Modified Fluid consistency: Thin; Room  service appropriate? Yes  Diet effective now                  Intake/Output Summary (Last 24 hours) at 03/16/2022 1313 Last data filed at 03/16/2022 1241 Gross per 24 hour  Intake 720 ml  Output 4225 ml  Net -3505 ml   Net IO Since Admission: -7,430.7 mL [03/16/22 1313]  Wt Readings from Last 3 Encounters:  03/13/22 (!) 139.7 kg     Unresulted Labs (From admission, onward)    None     Data Reviewed: I have personally reviewed following labs and imaging studies CBC: Recent Labs  Lab 03/13/22 2155 03/14/22 0926 03/15/22 0518  WBC 10.2 9.4 14.8*  NEUTROABS 7.0  --   --   HGB 14.6 16.1 13.9  HCT 44.1 49.1 41.9  MCV 95.5 96.1 96.3  PLT 261 299 266   Basic Metabolic Panel: Recent Labs  Lab 03/13/22 2155 03/14/22 0926 03/15/22 0518 03/16/22 0518  NA 137 140 140 139  K 4.5 5.0 4.7 4.6  CL 102 103 103 98  CO2  26 26 33* 33*  GLUCOSE 199* 195* 179* 206*  BUN 21 26* 24* 24*  CREATININE 1.11 0.92 0.55* 0.71  CALCIUM 9.3 9.6 8.8* 9.1  MG  --   --  2.2 2.2  PHOS  --   --  3.4 2.8   GFR: Estimated Creatinine Clearance: 136.9 mL/min (by C-G formula based on SCr of 0.71 mg/dL). Liver Function Tests: Recent Labs  Lab 03/13/22 2155  AST 32  ALT 23  ALKPHOS 48  BILITOT 0.7  PROT 7.9  ALBUMIN 4.0   Recent Labs    03/13/22 2155  HGBA1C 6.8*  CBG: Recent Labs  Lab 03/13/22 2325  GLUCAP 148*  Lipid Profile: Recent Results (from the past 240 hour(s))  Resp Panel by RT-PCR (Flu A&B, Covid) Anterior Nasal Swab     Status: None   Collection Time: 03/13/22 10:02 PM   Specimen: Anterior Nasal Swab  Result Value Ref Range Status   SARS Coronavirus 2 by RT PCR NEGATIVE NEGATIVE Final    Comment: (NOTE) SARS-CoV-2 target nucleic acids are NOT DETECTED.  The SARS-CoV-2 RNA is generally detectable in upper respiratory specimens during the acute phase of infection. The lowest concentration of SARS-CoV-2 viral copies this assay can detect is 138 copies/mL. A  negative result does not preclude SARS-Cov-2 infection and should not be used as the sole basis for treatment or other patient management decisions. A negative result may occur with  improper specimen collection/handling, submission of specimen other than nasopharyngeal swab, presence of viral mutation(s) within the areas targeted by this assay, and inadequate number of viral copies(<138 copies/mL). A negative result must be combined with clinical observations, patient history, and epidemiological information. The expected result is Negative.  Fact Sheet for Patients:  BloggerCourse.com  Fact Sheet for Healthcare Providers:  SeriousBroker.it  This test is no t yet approved or cleared by the [place] FDA and  has been authorized for detection and/or diagnosis of SARS-CoV-2 by FDA under an Emergency Use Authorization (EUA). This EUA will remain  in effect (meaning this test can be used) for the duration of the COVID-19 declaration under Section 564(b)(1) of the Act, 21 U.S.C.section 360bbb-3(b)(1), unless the authorization is terminated  or revoked sooner.       Influenza A by PCR NEGATIVE NEGATIVE Final   Influenza B by PCR NEGATIVE NEGATIVE Final    Comment: (NOTE) The Xpert Xpress SARS-CoV-2/FLU/RSV plus assay is intended as an aid in the diagnosis of influenza from Nasopharyngeal swab specimens and should not be used as a sole basis for treatment. Nasal washings and aspirates are unacceptable for Xpert Xpress SARS-CoV-2/FLU/RSV testing.  Fact Sheet for Patients: BloggerCourse.com  Fact Sheet for Healthcare Providers: SeriousBroker.it  This test is not yet approved or cleared by the [place] FDA and has been authorized for detection and/or diagnosis of SARS-CoV-2 by FDA under an Emergency Use Authorization (EUA). This EUA will remain in effect (meaning this test can  be used) for the duration of the COVID-19 declaration under Section 564(b)(1) of the Act, 21 U.S.C. section 360bbb-3(b)(1), unless the authorization is terminated or revoked.  Performed at [facility], [address] [place], [place] [address]   MRSA Next Gen by PCR, Nasal     Status: None   Collection Time: 03/13/22 11:29 PM   Specimen: Nasal Mucosa; Nasal Swab  Result Value Ref Range Status   MRSA by PCR Next Gen NOT DETECTED NOT DETECTED Final    Comment: (NOTE) The GeneXpert MRSA Assay (  FDA approved for NASAL specimens only), is one component of a comprehensive MRSA colonization surveillance program. It is not intended to diagnose MRSA infection nor to guide or monitor treatment for MRSA infections. Test performance is not FDA approved in patients less than 63 years old. Performed at [facility], [address] [place], [place] [address]     Antimicrobials: Anti-infectives (From admission, onward)    None      Culture/Microbiology No results found for: "SDES", "SPECREQUEST", "CULT", "REPTSTATUS"   Radiology Studies: ECHOCARDIOGRAM COMPLETE  Result Date: 03/15/2022    ECHOCARDIOGRAM REPORT   Patient Name:   Willie KindsMOTHY L Pacholski Date of Exam: 03/14/2022 Medical Rec #:  [id number]           Height:       72.0 in Accession #:    [id number]          Weight:       308.0 lb Date of Birth:  07/13/1960            BSA:          2.560 m Patient Age:    61 years            BP:           104/69 mmHg Patient Gender: M                   HR:           86 bpm. Exam Location:  [facility] Procedure: 2D Echo, Cardiac Doppler, Color Doppler and Intracardiac            Opacification Agent Indications:     Acute Myocardial Infarction, Unspecified I21.9  History:         Patient has no prior history of Echocardiogram examinations.  Sonographer:     Thurman Coyerasey Kirkpatrick RDCS Referring Phys:  [id number] [facility] MICHELLE TANG Diagnosing Phys: Marcina MillardAlexander Paraschos MD IMPRESSIONS  1. Left ventricular  ejection fraction, by estimation, is 35 to 40%. The left ventricle has moderately decreased function. The left ventricle demonstrates global hypokinesis. The left ventricular internal cavity size was mildly to moderately dilated. Left ventricular diastolic parameters were normal.  2. Right ventricular systolic function is normal. The right ventricular size is normal.  3. The mitral valve is normal in structure. Moderate mitral valve regurgitation. No evidence of mitral stenosis.  4. The aortic valve is normal in structure. Aortic valve regurgitation is not visualized. Mild aortic valve stenosis.  5. The inferior vena cava is normal in size with greater than 50% respiratory variability, suggesting right atrial pressure of 3 mmHg. FINDINGS  Left Ventricle: Left ventricular ejection fraction, by estimation, is 35 to 40%. The left ventricle has moderately decreased function. The left ventricle demonstrates global hypokinesis. Definity contrast agent was given IV to delineate the left ventricular endocardial borders. The left ventricular internal cavity size was mildly to moderately dilated. There is no left ventricular hypertrophy. Left ventricular diastolic parameters were normal. Right Ventricle: The right ventricular size is normal. No increase in right ventricular wall thickness. Right ventricular systolic function is normal. Left Atrium: Left atrial size was normal in size. Right Atrium: Right atrial size was normal in size. Pericardium: There is no evidence of pericardial effusion. Mitral Valve: The mitral valve is normal in structure. Moderate mitral valve regurgitation. No evidence of mitral valve stenosis. Tricuspid Valve: The tricuspid valve is normal in structure. Tricuspid valve regurgitation is not demonstrated. No evidence of tricuspid stenosis. Aortic Valve: The aortic  valve is normal in structure. Aortic valve regurgitation is not visualized. Mild aortic stenosis is present. Aortic valve mean gradient  measures 12.0 mmHg. Aortic valve peak gradient measures 21.2 mmHg. Aortic valve area, by VTI measures 1.87 cm. Pulmonic Valve: The pulmonic valve was normal in structure. Pulmonic valve regurgitation is not visualized. No evidence of pulmonic stenosis. Aorta: The aortic root is normal in size and structure. Venous: The inferior vena cava is normal in size with greater than 50% respiratory variability, suggesting right atrial pressure of 3 mmHg. IAS/Shunts: No atrial level shunt detected by color flow Doppler.  LEFT VENTRICLE PLAX 2D LVIDd:         7.10 cm   Diastology LVIDs:         5.80 cm   LV e' medial:    5.92 cm/s LV PW:         1.20 cm   LV E/e' medial:  16.2 LV IVS:        0.90 cm   LV e' lateral:   11.70 cm/s LVOT diam:     2.70 cm   LV E/e' lateral: 8.2 LV SV:         77 LV SV Index:   30 LVOT Area:     5.73 cm  RIGHT VENTRICLE RV S prime:     15.10 cm/s TAPSE (M-mode): 2.9 cm LEFT ATRIUM              Index LA diam:        4.90 cm  1.91 cm/m LA Vol (A2C):   103.0 ml 40.24 ml/m LA Vol (A4C):   65.8 ml  25.71 ml/m LA Biplane Vol: 86.5 ml  33.79 ml/m  AORTIC VALVE AV Area (Vmax):    1.85 cm AV Area (Vmean):   1.77 cm AV Area (VTI):     1.87 cm AV Vmax:           230.00 cm/s AV Vmean:          161.000 cm/s AV VTI:            0.411 m AV Peak Grad:      21.2 mmHg AV Mean Grad:      12.0 mmHg LVOT Vmax:         74.40 cm/s LVOT Vmean:        49.800 cm/s LVOT VTI:          0.134 m LVOT/AV VTI ratio: 0.33  AORTA Ao Root diam: 3.40 cm MITRAL VALVE MV Area (PHT): 3.99 cm       SHUNTS MV Decel Time: 190 msec       Systemic VTI:  0.13 m MR Peak grad:    100.4 mmHg   Systemic Diam: 2.70 cm MR Mean grad:    60.0 mmHg MR Vmax:         501.00 cm/s MR Vmean:        366.0 cm/s MR PISA:         4.02 cm MR PISA Eff ROA: 30 mm MR PISA Radius:  0.80 cm MV E velocity: 95.90 cm/s MV A velocity: 86.50 cm/s MV E/A ratio:  1.11 Marcina Millard MD Electronically signed by Marcina Millard MD Signature Date/Time:  03/15/2022/1:24:09 PM    Final    DG Chest Port 1 View  Result Date: 03/14/2022 CLINICAL DATA:  Evaluate for aspiration pneumonia. EXAM: PORTABLE CHEST 1 VIEW COMPARISON:  03/13/2022 FINDINGS: Stable cardiomediastinal contours. No signs of pleural effusion. No airspace opacities identified.  Unchanged mild diffuse increase interstitial markings. Visualized osseous structures appear intact. IMPRESSION: Unchanged mild diffuse increase interstitial markings. No airspace opacities identified. Electronically Signed   By: Signa Kell M.D.   On: 03/14/2022 15:06     LOS: 3 days   Lanae Boast, MD [facility]  03/16/2022, 1:13 PM

## 2022-03-17 DIAGNOSIS — I2111 ST elevation (STEMI) myocardial infarction involving right coronary artery: Secondary | ICD-10-CM | POA: Diagnosis not present

## 2022-03-17 LAB — HEMOGLOBIN AND HEMATOCRIT, BLOOD
HCT: 42.2 % (ref 39.0–52.0)
Hemoglobin: 13.7 g/dL (ref 13.0–17.0)

## 2022-03-17 LAB — GLUCOSE, CAPILLARY
Glucose-Capillary: 171 mg/dL — ABNORMAL HIGH (ref 70–99)
Glucose-Capillary: 194 mg/dL — ABNORMAL HIGH (ref 70–99)
Glucose-Capillary: 227 mg/dL — ABNORMAL HIGH (ref 70–99)
Glucose-Capillary: 186 mg/dL — ABNORMAL HIGH (ref 70–99)
Glucose-Capillary: 148 mg/dL — ABNORMAL HIGH (ref 70–99)

## 2022-03-17 MED ORDER — PREDNISONE 10 MG PO TABS
10.0000 mg | ORAL_TABLET | Freq: Every day | ORAL | Status: DC
  Administered 2022-03-18: 10 mg via ORAL
  Filled 2022-03-17: qty 1

## 2022-03-17 MED ORDER — PREDNISONE 10 MG PO TABS: 10.0000 mg | ORAL_TABLET | Freq: Every day | ORAL | Status: DC

## 2022-03-17 MED ORDER — GLIPIZIDE 5 MG PO TABS
5.0000 mg | ORAL_TABLET | Freq: Every day | ORAL | Status: DC
  Filled 2022-03-17: qty 1

## 2022-03-17 MED ORDER — PREDNISONE 20 MG PO TABS: 30.0000 mg | ORAL_TABLET | Freq: Every day | ORAL | Status: DC

## 2022-03-17 MED ORDER — PREDNISONE 20 MG PO TABS: 20.0000 mg | ORAL_TABLET | Freq: Every day | ORAL | Status: DC

## 2022-03-17 MED ORDER — IPRATROPIUM-ALBUTEROL 0.5-2.5 (3) MG/3ML IN SOLN
3.0000 mL | RESPIRATORY_TRACT | Status: DC | PRN
  Administered 2022-03-17 – 2022-03-18 (×3): 3 mL via RESPIRATORY_TRACT
  Filled 2022-03-17 (×2): qty 3

## 2022-03-17 NOTE — Plan of Care (Signed)
  Problem: Education: Goal: Knowledge of General Education information will improve Description: Including pain rating scale, medication(s)/side effects and non-pharmacologic comfort measures Outcome: Progressing   Problem: Clinical Measurements: Goal: Ability to maintain clinical measurements within normal limits will improve Outcome: Progressing Goal: Cardiovascular complication will be avoided Outcome: Progressing   

## 2022-03-17 NOTE — TOC Initial Note (Signed)
Transition of Care [facility]) - Initial/Assessment Note    Patient Details  Name: [name] MRN: [id number] Date of Birth: [birth year]  [facility] CM/SW Contact:    Darolyn Rua, LCSW Phone Number: 03/17/2022, 3:16 PM  Clinical Narrative:                  [facility] consult for substance abuse resources, patient agreeable to resource packet. TOC also consulted for medication assistance, patient goes to [facility] in [place]. Reports he is in disability and anticipates having problems with obtaining brilinta and jardiance, however did show CSW his coupons from pharmacy he can use. Reports he continue to see [place]CP [place] Marc Leichter but he doesn't remember the name.   No other discharge needs noted at this time.   Expected Discharge Plan: Home/Self Care Barriers to Discharge: Continued Medical Work up   Patient Goals and CMS Choice Patient states their goals for this hospitalization and ongoing recovery are:: to go home CMS Medicare.gov Compare Post Acute Care list provided to:: Patient Choice offered to / list presented to : Patient  Expected Discharge Plan and Services Expected Discharge Plan: Home/Self Care       Living arrangements for the past 2 months: Single Family Home                                      Prior Living Arrangements/Services Living arrangements for the past 2 months: Single Family Home Lives with:: Self                   Activities of Daily Living Home Assistive Devices/Equipment: CPAP ADL Screening (condition at time of admission) Patient's cognitive ability adequate to safely complete daily activities?: Yes Is the patient deaf or have difficulty hearing?: No Does the patient have difficulty seeing, even when wearing glasses/contacts?: No Does the patient have difficulty concentrating, remembering, or making decisions?: No Patient able to express need for assistance with ADLs?: No Does the patient have difficulty dressing or  bathing?: No Independently performs ADLs?: Yes (appropriate for developmental age) Does the patient have difficulty walking or climbing stairs?: Yes Weakness of Legs: Both Weakness of Arms/Hands: None  Permission Sought/Granted                  Emotional Assessment              Admission diagnosis:  COPD exacerbation (HCC) [J44.1] Inferior ST segment elevation [R94.31] STEMI involving right coronary artery (HCC) [I21.11] Acute ST elevation myocardial infarction (STEMI) involving other coronary artery of inferior wall (HCC) [I21.19] Patient Active Problem List   Diagnosis Date Noted   Chronic HFrEF (heart failure with reduced ejection fraction) (HCC) 03/14/2022   NSVT (nonsustained ventricular tachycardia) (HCC) 03/14/2022   Acute hypoxemic respiratory failure (HCC) 03/14/2022   Alcohol withdrawal delirium (HCC) 03/14/2022   Obesity, Class III, BMI 40-49.9 (morbid obesity) (HCC) 03/14/2022   STEMI involving right coronary artery (HCC) 03/13/2022   Acute ST elevation myocardial infarction (STEMI) involving other coronary artery of inferior wall (HCC) 03/13/2022   Dyslipidemia 03/13/2022   Essential hypertension 03/13/2022   COPD with acute exacerbation (HCC) 03/13/2022   Psoriatic arthritis (HCC) 03/13/2022   Obstructive sleep apnea 03/13/2022   PCP:  Patient, No Pcp Per Pharmacy:  No Pharmacies Listed    Social Determinants of Health (SDOH) Interventions    Readmission Risk Interventions  No data to display

## 2022-03-17 NOTE — Progress Notes (Signed)
SATURATION QUALIFICATIONS: (This note is used to comply with regulatory documentation for home oxygen)  Patient Saturations on Room Air at Rest = 92%  Patient Saturations on Room Air while Ambulating = 94%  Patient Saturations on n/a Liters of oxygen while Ambulating = n/a%  Please briefly explain why patient needs home oxygen:

## 2022-03-17 NOTE — Progress Notes (Signed)
[facility] NOTE       Patient ID: TYSE AURIEMMA MRN: [id number] DOB/AGE: 63-Oct-1962 63 y.o.  Admit date: 03/13/2022 Referring Physician Dr. Jacqualine Code Primary Physician [facility] Primary Cardiologist Dr. Hunt Oris [facility])  Reason for Consultation STEMI  HPI: Willie Santos is a 4TKZ with a PMH of CAD s/p x4 to the mid and distal LAD in 2014 and 2015, HFrEF (30-35%, G2 DD 08/2020) HTN, OSA, ongoing tobacco use, alcohol abuse, DM2 (A1c 6.8%), obesity, psoriatic arthritis on methotrexate who presented to [facility] via EMS on 03/13/2022 as a code STEMI.  EKG concerning for inferior STE.  He was taken urgently to the Cath Lab where he underwent successful PCI with DES to the RCA by Dr. Lujean Amel, and Aggrastat infusion post procedurally.  Overnight on 11/6, he was having issues with delirium/agitation and desaturating on room air, improved with HFNC.  He has been chest pain-free since his LHC.  Interval History:  -no acute events -remains chest pain free. Feels like his shortness of breath is at baseline (says he was labored like this at home). He is aware he was told to have a repeat sleep study done.  -received his lovenox injection in his RLQ abdomen last night that has formed a hematoma. He has some burning discomfort with palpation in that area. No oozing or issues with his R wrist arteriotomy site.   Review of systems complete and found to be negative unless listed above     No past medical history on file.    Medications Prior to Admission  Medication Sig Dispense Refill Last Dose   Adalimumab (HUMIRA PEN) 40 MG/0.4ML PNKT Inject 1 Dose into the skin once a week.   Past Week   albuterol (PROVENTIL) (2.5 MG/3ML) 0.083% nebulizer solution Take 2.5 mg by nebulization every 4 (four) hours as needed.   prn at prn   albuterol (VENTOLIN HFA) 108 (90 Base) MCG/ACT inhaler Inhale 1-2 puffs into the lungs every 4 (four) hours as needed.   prn at prn   aspirin EC 81 MG  tablet Take 1 tablet by mouth daily.   Past Week   atorvastatin (LIPITOR) 40 MG tablet Take 1 tablet by mouth daily.   Past Week   Fluticasone-Umeclidin-Vilant (TRELEGY ELLIPTA) 100-62.5-25 MCG/ACT AEPB Inhale 1 puff into the lungs daily.   Past Week   folic acid (FOLVITE) 1 MG tablet Take 1 tablet by mouth daily.   Past Week   gabapentin (NEURONTIN) 300 MG capsule Take 600 mg by mouth at bedtime.   Past Week   ipratropium-albuterol (DUONEB) 0.5-2.5 (3) MG/3ML SOLN Inhale 3 mLs into the lungs every 6 (six) hours as needed.   Past Month   losartan (COZAAR) 100 MG tablet Take 100 mg by mouth daily.   Past Week   methotrexate (RHEUMATREX) 2.5 MG tablet Take 20 mg by mouth once a week. Caution:Chemotherapy. Protect from light.   Past Week   metoprolol tartrate (LOPRESSOR) 25 MG tablet Take 25 mg by mouth 2 (two) times daily.   Past Week   oxyCODONE (OXY IR/ROXICODONE) 5 MG immediate release tablet Take 5 mg by mouth 4 (four) times daily as needed.   Past Week   spironolactone (ALDACTONE) 25 MG tablet Take 25 mg by mouth daily.   Past Week   Social History   Socioeconomic History   Marital status: Single    Spouse name: Not on file   Number of children: Not on file   Years of  education: Not on file   Highest education level: Not on file  Occupational History   Not on file  Tobacco Use   Smoking status: Not on file   Smokeless tobacco: Not on file  Substance and Sexual Activity   Alcohol use: Not on file   Drug use: Not on file   Sexual activity: Not on file  Other Topics Concern   Not on file  Social History Narrative   Not on file   Social Determinants of Health   Financial Resource Strain: Not on file  Food Insecurity: Not on file  Transportation Needs: Not on file  Physical Activity: Not on file  Stress: Not on file  Social Connections: Not on file  Intimate Partner Violence: Not on file    No family history on file.   Vitals:   03/17/22 0534 03/17/22 0549 03/17/22 0803  03/17/22 0808  BP:  112/89  123/66  Pulse:  100  96  Resp:  20  18  Temp:  (!) 97.5 F (36.4 C)  98.2 F (36.8 C)  TempSrc:    Oral  SpO2: 96% 97% 97% 100%  Weight:      Height:        PHYSICAL EXAM General: Middle-aged obese Caucasian male, well nourished, in no acute distress.  Sitting upright in recliner HEENT:  Normocephalic and atraumatic. Neck:  No JVD.  Lungs: conversational dyspnea on oxygen by [place].  Decreased breath sounds without appreciable crackles or wheezes.   Heart: HRRR . Normal S1 and S2 without gallops or murmurs.  Abdomen: Non-distended appearing with excess adiposity. RLQ with ~8-10cm hematoma at site of lovenox injection, slight burning discomfort with palpation. Msk: Normal strength and tone for age. Extremities: Warm and well perfused. No clubbing, cyanosis.  Trace bilateral lower extremity edema. Scattered erythematous macules on lower legs c/w psoriatic arthritis. R wrist arteriotomy site well healed without bruising or bleeding (gauze and tegaderm removed) Neuro: Alert and oriented X 3. Psych:  Answers questions appropriately.   Labs: Basic Metabolic Panel: Recent Labs    03/15/22 0518 03/16/22 0518  NA 140 139  K 4.7 4.6  CL 103 98  CO2 33* 33*  GLUCOSE 179* 206*  BUN 24* 24*  CREATININE 0.55* 0.71  CALCIUM 8.8* 9.1  MG 2.2 2.2  PHOS 3.4 2.8    Liver Function Tests: No results for input(s): "AST", "ALT", "ALKPHOS", "BILITOT", "PROT", "ALBUMIN" in the last 72 hours.  No results for input(s): "LIPASE", "AMYLASE" in the last 72 hours. CBC: Recent Labs    03/15/22 0518 03/17/22 0812  WBC 14.8*  --   HGB 13.9 13.7  HCT 41.9 42.2  MCV 96.3  --   PLT 266  --     Cardiac Enzymes: No results for input(s): "CKTOTAL", "CKMB", "CKMBINDEX", "TROPONINIHS" in the last 72 hours.  BNP: No results for input(s): "BNP" in the last 72 hours.  D-Dimer: No results for input(s): "DDIMER" in the last 72 hours. Hemoglobin A1C: No results for  input(s): "HGBA1C" in the last 72 hours.  Fasting Lipid Panel: No results for input(s): "CHOL", "HDL", "LDLCALC", "TRIG", "CHOLHDL", "LDLDIRECT" in the last 72 hours.  Thyroid Function Tests: No results for input(s): "TSH", "T4TOTAL", "T3FREE", "THYROIDAB" in the last 72 hours.  Invalid input(s): "FREET3" Anemia Panel: No results for input(s): "VITAMINB12", "FOLATE", "FERRITIN", "TIBC", "IRON", "RETICCTPCT" in the last 72 hours.   Radiology: ECHOCARDIOGRAM COMPLETE  Result Date: 03/15/2022    ECHOCARDIOGRAM REPORT   Patient Name:  Harris L Caporaso Date of Exam: 03/14/2022 Medical Rec #:  [id number]           Height:       72.0 in Accession #:    [id number]          Weight:       308.0 lb Date of Birth:  1960/11/03            BSA:          2.560 m Patient Age:    61 years            BP:           104/69 mmHg Patient Gender: M                   HR:           86 bpm. Exam Location:  [facility] Procedure: 2D Echo, Cardiac Doppler, Color Doppler and Intracardiac            Opacification Agent Indications:     Acute Myocardial Infarction, Unspecified I21.9  History:         Patient has no prior history of Echocardiogram examinations.  Sonographer:     Mikki Santee RDCS Referring Phys:  [id number] [place] Ambers Iyengar Diagnosing Phys: Isaias Cowman MD IMPRESSIONS  1. Left ventricular ejection fraction, by estimation, is 35 to 40%. The left ventricle has moderately decreased function. The left ventricle demonstrates global hypokinesis. The left ventricular internal cavity size was mildly to moderately dilated. Left ventricular diastolic parameters were normal.  2. Right ventricular systolic function is normal. The right ventricular size is normal.  3. The mitral valve is normal in structure. Moderate mitral valve regurgitation. No evidence of mitral stenosis.  4. The aortic valve is normal in structure. Aortic valve regurgitation is not visualized. Mild aortic valve stenosis.  5. The inferior vena cava is  normal in size with greater than 50% respiratory variability, suggesting right atrial pressure of 3 mmHg. FINDINGS  Left Ventricle: Left ventricular ejection fraction, by estimation, is 35 to 40%. The left ventricle has moderately decreased function. The left ventricle demonstrates global hypokinesis. Definity contrast agent was given IV to delineate the left ventricular endocardial borders. The left ventricular internal cavity size was mildly to moderately dilated. There is no left ventricular hypertrophy. Left ventricular diastolic parameters were normal. Right Ventricle: The right ventricular size is normal. No increase in right ventricular wall thickness. Right ventricular systolic function is normal. Left Atrium: Left atrial size was normal in size. Right Atrium: Right atrial size was normal in size. Pericardium: There is no evidence of pericardial effusion. Mitral Valve: The mitral valve is normal in structure. Moderate mitral valve regurgitation. No evidence of mitral valve stenosis. Tricuspid Valve: The tricuspid valve is normal in structure. Tricuspid valve regurgitation is not demonstrated. No evidence of tricuspid stenosis. Aortic Valve: The aortic valve is normal in structure. Aortic valve regurgitation is not visualized. Mild aortic stenosis is present. Aortic valve mean gradient measures 12.0 mmHg. Aortic valve peak gradient measures 21.2 mmHg. Aortic valve area, by VTI measures 1.87 cm. Pulmonic Valve: The pulmonic valve was normal in structure. Pulmonic valve regurgitation is not visualized. No evidence of pulmonic stenosis. Aorta: The aortic root is normal in size and structure. Venous: The inferior vena cava is normal in size with greater than 50% respiratory variability, suggesting right atrial pressure of 3 mmHg. IAS/Shunts: No atrial level shunt detected by color flow Doppler.  LEFT VENTRICLE PLAX 2D LVIDd:  7.10 cm   Diastology LVIDs:         5.80 cm   LV e' medial:    5.92 cm/s LV PW:          1.20 cm   LV E/e' medial:  16.2 LV IVS:        0.90 cm   LV e' lateral:   11.70 cm/s LVOT diam:     2.70 cm   LV E/e' lateral: 8.2 LV SV:         77 LV SV Index:   30 LVOT Area:     5.73 cm  RIGHT VENTRICLE RV S prime:     15.10 cm/s TAPSE (M-mode): 2.9 cm LEFT ATRIUM              Index LA diam:        4.90 cm  1.91 cm/m LA Vol (A2C):   103.0 ml 40.24 ml/m LA Vol (A4C):   65.8 ml  25.71 ml/m LA Biplane Vol: 86.5 ml  33.79 ml/m  AORTIC VALVE AV Area (Vmax):    1.85 cm AV Area (Vmean):   1.77 cm AV Area (VTI):     1.87 cm AV Vmax:           230.00 cm/s AV Vmean:          161.000 cm/s AV VTI:            0.411 m AV Peak Grad:      21.2 mmHg AV Mean Grad:      12.0 mmHg LVOT Vmax:         74.40 cm/s LVOT Vmean:        49.800 cm/s LVOT VTI:          0.134 m LVOT/AV VTI ratio: 0.33  AORTA Ao Root diam: 3.40 cm MITRAL VALVE MV Area (PHT): 3.99 cm       SHUNTS MV Decel Time: 190 msec       Systemic VTI:  0.13 m MR Peak grad:    100.4 mmHg   Systemic Diam: 2.70 cm MR Mean grad:    60.0 mmHg MR Vmax:         501.00 cm/s MR Vmean:        366.0 cm/s MR PISA:         4.02 cm MR PISA Eff ROA: 30 mm MR PISA Radius:  0.80 cm MV E velocity: 95.90 cm/s MV A velocity: 86.50 cm/s MV E/A ratio:  1.11 Isaias Cowman MD Electronically signed by Isaias Cowman MD Signature Date/Time: 03/15/2022/1:24:09 PM    Final    CARDIAC CATHETERIZATION  Result Date: 03/14/2022   Mid RCA to Dist RCA lesion is 100% stenosed.   Mid Cx lesion is 50% stenosed.   Previously placed Ost LAD to Mid LAD stent of unknown type is  widely patent.   A drug-eluting stent was successfully placed using a STENT ONYX FRONTIER [id number].   Post intervention, there is a 0% residual stenosis.   There is mild left ventricular systolic dysfunction.   LV end diastolic pressure is moderately elevated.   The left ventricular ejection fraction is 50-55% by visual estimate. Conclusion Status post emergent cardiac catheter because of STEMI Patient had  relatively preserved left ventricular function EF around 50% left ventricular enlargement Coronaries Unremarkable left main LAD circumflex no significant disease Widely patent proximal to mid LAD stent moderate disease in circumflex all with TIMI-3 flow RCA Large 100% mid to distal IRA TIMI 0  flow Intervention Successful PCI and stent to mid to distal RCA with DES 3.0 x 18 mm [place] to 13 atm Lesion reduced from 100 down to 0% TIMI 0 flow back to TIMI-3 flow Procedure done radially TR band applied Patient maintained on aspirin and Brilinta statin beta-blocker ACE Aggrastat for 18 hours Patient tolerated procedure well No complications   DG Chest Port 1 View  Result Date: 03/14/2022 CLINICAL DATA:  Evaluate for aspiration pneumonia. EXAM: PORTABLE CHEST 1 VIEW COMPARISON:  03/13/2022 FINDINGS: Stable cardiomediastinal contours. No signs of pleural effusion. No airspace opacities identified. Unchanged mild diffuse increase interstitial markings. Visualized osseous structures appear intact. IMPRESSION: Unchanged mild diffuse increase interstitial markings. No airspace opacities identified. Electronically Signed   By: Kerby Moors M.D.   On: 03/14/2022 15:06   DG Chest Port 1 View  Result Date: 03/13/2022 CLINICAL DATA:  Chest pain; STEMI suspected EXAM: PORTABLE CHEST 1 VIEW COMPARISON:  radiographs 05/24/2008 FINDINGS: Defibrillator pad over the left chest. Mild cardiomegaly. Bilateral interstitial coarsening. No focal consolidation, pleural effusion, or pneumothorax. No acute osseous abnormality. IMPRESSION: Bilateral interstitial coarsening may be due to COPD though superimposed edema could be considered. Cardiomegaly. Electronically Signed   By: Placido Sou M.D.   On: 03/13/2022 22:11    ECHO 35-40%, global hypo-, mild to moderate LV dilation, moderate MR, mild AS  TELEMETRY reviewed by me (LT) 03/17/2022 : Sinus rhythm 70s to 90s  Data reviewed by me (LT) 03/17/2022: Hospitalist progress  note, last  CBC BMP vitals telemetry I&Os  Principal Problem:   STEMI involving right coronary artery (HCC) Active Problems:   Acute ST elevation myocardial infarction (STEMI) involving other coronary artery of inferior wall (HCC)   Dyslipidemia   Essential hypertension   COPD with acute exacerbation (HCC)   Psoriatic arthritis ([place])   Obstructive sleep apnea   Chronic HFrEF (heart failure with reduced ejection fraction) (HCC)   NSVT (nonsustained ventricular tachycardia) (HCC)   Acute hypoxemic respiratory failure (HCC)   Alcohol withdrawal delirium (HCC)   Obesity, Class III, BMI 40-49.9 (morbid obesity) ([place])    ASSESSMENT AND PLAN:  Willie Santos is a 43yoM with a PMH of CAD s/p PCI x4 to the mid and distal LAD in 2014 and 2015, HFrEF (30-35%, G2 DD 08/2020) HTN, OSA, ongoing tobacco use, alcohol abuse, DM2 (A1c 6.8%), obesity who presented to [facility] via EMS on 03/13/2022 as a code STEMI.  EKG concerning for inferior STE.  He was taken urgently to the Cath Lab where he underwent successful PCI with DES to the RCA by Dr. Lujean Amel, and Aggrastat infusion post procedurally.  Overnight, he was having issues with delirium/agitation and desaturating on room air, improved with HFNC.  He has been chest pain-free since his LHC.  #Inferior STEMI #CAD s/p PCI x4 mid and distal LAD 2014, 2015 He received DES x1 to the RCA on 11/5 and Aggrastat infusion post procedurally.  He has been chest pain-free since.  Per review of records, he was last seen by his cardiologist at [facility] 01/2021 his GDMT was escalated, and he was referred to a pulmonologist (unclear if he ever saw them). -Continue DAPT with aspirin and Brilinta uninterrupted for 1 year  -expressed importance of compliance with DAPT specifically.  -Continue atorvastatin 80 mg daily -Continue losartan 100 mg daily,  -Continue metoprolol 50 mg twice daily -Continue spironolactone 25 mg once a day -start jardiance 60m today, which has a  lower copay than farxiga.  -ok for discharge  from a cardiac standpoint today.  -Needs cardiac rehab at discharge -He prefers to continue his cardiology care at [facility] at discharge, last seen by Dr. Hunt Oris.  #Chronic HFrEF #Mild AS, moderate MR Repeat echo this admission shows similar EF compared to prior last year at 35-40% with moderately reduced LVEF and global hypokinesis.  Suspect ischemic in nature, worsened by ongoing tobacco and alcohol use -Continue GDMT as above. BNP is minimally elevated at 177, with trace peripheral edema he says is at his baseline.  Suspect more pulmonary component to see looks relatively euvolemic. -wean O2 as tolerated per primary team -Will need surveillance of his AS and MR routinely  #Type 2 diabetes A1c 6.8%, would benefit from GLP-1/SGLT2i as above.   #COPD, ongoing tobacco use #Suspected OSA -Needs a sleep study on outpatient basis.  Discussed smoking cessation with him in detail, he is motivated to quit.  #Alcohol abuse -Continue CIWA while inpatient -Discussed complete cessation from alcohol  OK for discharge from a cardiac standpoint today. Follow up with Dr. Moshe Cipro at [facility] in 1-2 weeks, per patient preference.   This patient's plan of care was discussed and created with Dr. Saralyn Pilar and he is in agreement.  Signed: Tristan Schroeder , PA-C 03/17/2022, 9:38 AM [facility] Cardiology

## 2022-03-17 NOTE — Progress Notes (Signed)
PROGRESS NOTE Willie Santos  [id number] DOB: 1960-10-12 DOA: 03/13/2022 PCP: Patient, No Pcp Per   Brief Narrative/Hospital Course: 61 y.o. obese Caucasian male with medical history significant for CAD, combined systolic and diastolic CHF essential hypertension, emphysema, OSA, psoriatic arthritis, depression, dyslipidemia and history of tobacco and alcohol abuse, who presented to the ED with acute onset of chest pain. In ED;  Peak troponin was more than 24,000.  EKG showed ST elevation. Patient with emergency brought to the Cath Lab, drug-eluting eluding stent was placed in RCA, patient started on dual antiplatelet treatment. Overnight over the procedure, patient developed significant agitation and confusion.  He was also placed on BiPAP for hypoxemia.  Placed on IV steroids for possible COPD exacerbation. 11/7, he is back on 4 L oxygen Transferred out of ICU. Overall patient is much improved is now on 2 L nasal cannula ambulating and has baseline dyspnea on exertion.  No more wheezing he is finishing his steroid with plan to quickly taper given his new onset diabetes. Seen by diabetes coordinator educated on blood glucose monitoring PCP follow-up.  Started on [place] here. Remains chest pain free    Subjective: Seen and examined  patient gets short of breath and winded with minimal activity. Able to come off oxygen Tapering off steroid   Assessment and Plan: Principal Problem:   STEMI involving right coronary artery ([place]) Active Problems:   Essential hypertension   Dyslipidemia   COPD with acute exacerbation (HCC)   Psoriatic arthritis (HCC)   Chronic HFrEF (heart failure with reduced ejection fraction) (HCC)   Acute ST elevation myocardial infarction (STEMI) involving other coronary artery of inferior wall (HCC)   Obstructive sleep apnea   NSVT (nonsustained ventricular tachycardia) (HCC)   Acute hypoxemic respiratory failure (HCC)   Alcohol withdrawal delirium (HCC)    Obesity, Class III, BMI 40-49.9 (morbid obesity) ([place])  Acute hypoxic respiratory failure due to COPD exacerbation: Acute COPD exacerbation: Patient had respiratory distress as well as hypoxemia 11/5 night in the setting of altered mental status and agitation.  Managed with BiPAP supplemental oxygen systemic steroids.  Overall respiratory status stable currently off o2- we will check ONO> He has dyspnea on exertion with activity tapering off steroid  Cont bronchodilator/DuoNeb,Dulera.Has not needed BiPAP for at least 2 nights. Will need sleep evaluation as outpatient. Checking for o2 need during sleep  STEMI: s/p RCA DES stent placement on DAPT with statin, per cardiology.  NSVT: Stable, on metoprolol, on monitor. Chronic systolic CHF: EF with [id number], normal RV systolic function mitral valve aortic valve normal.  Euvolemic, continue current metoprolol, losartan and Aldactone.Monitor intake output Daily weight  Delirium tremens with alcohol abuse: At this time improved stable. Continue multivitamin, thiamine and folate.  Psoriasis w/ Psoriatic arthritis: With chronic skin rashes and psoriatic arthritis, on methotrexate and Humira. Delirium tremens with alcohol abuse: mentation improved.  On CIWA protocol-no sign of withdrawal now. New onset diabetes: A1c 6.8 seen by diabetes coordinator will keep on a sliding scale insulin, taper off prednisone-continue Jardiance. His wife will monitor his cbg as she is diabetic he says.  DM coordinator input appreciated Recent Labs  Lab 03/13/22 2155 03/13/22 2325 03/16/22 1600 03/16/22 2341 03/17/22 0508 03/17/22 0803 03/17/22 1125  GLUCAP  --    < > 170* 185* 171* 194* 227*  HGBA1C 6.8*  --   --   --   --   --   --    < > = values in this interval not displayed.  Morbid Obesity:Patient's Body mass index is 41.77 kg/m. : Will benefit with PCP follow-up, weight loss  healthy lifestyle and outpatient sleep evaluation. Voiding off foley  DVT  prophylaxis: Place and maintain sequential compression device Start: 03/17/22 1500Lovenox Code Status:   Code Status: Full Code Family Communication: plan of care discussed with patient at bedside. Patient status is: Inpatient because of respiratory failure Level of care: Telemetry Cardiac   Dispo: The patient is from: Home            Anticipated disposition: home tomorrow after overnight oximetry  Objective: Vitals last 24 hrs: Vitals:   03/17/22 0803 03/17/22 0808 03/17/22 1134 03/17/22 1212  BP:  123/66 107/67   Pulse:  96 (!) 101   Resp:  18 (!) 24   Temp:  98.2 F (36.8 C)    TempSrc:  Oral    SpO2: 97% 100% 95% 94%  Weight:      Height:       Weight change:   Physical Examination: General exam: AAOX3, Obese, weak,older appearing HEENT:Oral mucosa moist, Ear/Nose WNL grossly, dentition normal. Respiratory system: bilaterally diminished BS w/ air entry+,no use of accessory muscle Cardiovascular system: S1 & S2 +, regular rate, JVD neg. Gastrointestinal system: Abdomen soft, NT,ND,BS+ Nervous System:Alert, awake, moving extremities and grossly nonfocal Extremities: LE ankle edema mild, lower extremities warm Skin: No rashes,no icterus. MSK: Normal muscle bulk,tone, power   Medications reviewed:  Scheduled Meds:  aspirin  81 mg Oral Daily   atorvastatin  80 mg Oral Daily   Chlorhexidine Gluconate Cloth  6 each Topical Daily   empagliflozin  10 mg Oral Daily   fluticasone furoate-vilanterol  1 puff Inhalation Daily   folic acid  1 mg Oral Daily   gabapentin  600 mg Oral QHS   insulin aspart  0-9 Units Subcutaneous TID WC   ipratropium-albuterol  3 mL Nebulization Q6H   losartan  100 mg Oral Daily   methotrexate  20 mg Oral Weekly   metoprolol tartrate  50 mg Oral BID   multivitamin with minerals  1 tablet Oral Daily   nicotine  21 mg Transdermal Daily   [START ON 03/18/2022] predniSONE  10 mg Oral Q breakfast   sodium chloride flush  3 mL Intravenous Q12H    spironolactone  25 mg Oral Daily   thiamine  100 mg Oral Daily   Or   thiamine  100 mg Intravenous Daily   ticagrelor  90 mg Oral BID   umeclidinium bromide  1 puff Inhalation Daily   Continuous Infusions:  sodium chloride        Diet Order             Diet Carb Modified Fluid consistency: Thin; Room service appropriate? Yes  Diet effective now                  Intake/Output Summary (Last 24 hours) at 03/17/2022 1302 Last data filed at 03/17/2022 0700 Gross per 24 hour  Intake 480 ml  Output 1325 ml  Net -845 ml    Net IO Since Admission: -8,275.7 mL [03/17/22 1302]  Wt Readings from Last 3 Encounters:  03/13/22 (!) 139.7 kg     Unresulted Labs (From admission, onward)    None     Data Reviewed: I have personally reviewed following labs and imaging studies CBC: Recent Labs  Lab 03/13/22 2155 03/14/22 0926 03/15/22 0518 03/17/22 0812  WBC 10.2 9.4 14.8*  --   NEUTROABS 7.0  --   --   --  HGB 14.6 16.1 13.9 13.7  HCT 44.1 49.1 41.9 42.2  MCV 95.5 96.1 96.3  --   PLT 261 299 266  --     Basic Metabolic Panel: Recent Labs  Lab 03/13/22 2155 03/14/22 0926 03/15/22 0518 03/16/22 0518  NA 137 140 140 139  K 4.5 5.0 4.7 4.6  CL 102 103 103 98  CO2 26 26 33* 33*  GLUCOSE 199* 195* 179* 206*  BUN 21 26* 24* 24*  CREATININE 1.11 0.92 0.55* 0.71  CALCIUM 9.3 9.6 8.8* 9.1  MG  --   --  2.2 2.2  PHOS  --   --  3.4 2.8    GFR: Estimated Creatinine Clearance: 136.9 mL/min (by C-G formula based on SCr of 0.71 mg/dL). Liver Function Tests: Recent Labs  Lab 03/13/22 2155  AST 32  ALT 23  ALKPHOS 48  BILITOT 0.7  PROT 7.9  ALBUMIN 4.0    No results for input(s): "HGBA1C" in the last 72 hours. CBG: Recent Labs  Lab 03/16/22 1600 03/16/22 2341 03/17/22 0508 03/17/22 0803 03/17/22 1125  GLUCAP 170* 185* 171* 194* 227*   Lipid Profile: Recent Results (from the past 240 hour(s))  Resp Panel by RT-PCR (Flu A&B, Covid) Anterior Nasal Swab      Status: None   Collection Time: 03/13/22 10:02 PM   Specimen: Anterior Nasal Swab  Result Value Ref Range Status   SARS Coronavirus 2 by RT PCR NEGATIVE NEGATIVE Final    Comment: (NOTE) SARS-CoV-2 target nucleic acids are NOT DETECTED.  The SARS-CoV-2 RNA is generally detectable in upper respiratory specimens during the acute phase of infection. The lowest concentration of SARS-CoV-2 viral copies this assay can detect is 138 copies/mL. A negative result does not preclude SARS-Cov-2 infection and should not be used as the sole basis for treatment or other patient management decisions. A negative result may occur with  improper specimen collection/handling, submission of specimen other than nasopharyngeal swab, presence of viral mutation(s) within the areas targeted by this assay, and inadequate number of viral copies(<138 copies/mL). A negative result must be combined with clinical observations, patient history, and epidemiological information. The expected result is Negative.  Fact Sheet for Patients:  EntrepreneurPulse.com.au  Fact Sheet for Healthcare Providers:  IncredibleEmployment.be  This test is no t yet approved or cleared by the [place] FDA and  has been authorized for detection and/or diagnosis of SARS-CoV-2 by FDA under an Emergency Use Authorization (EUA). This EUA will remain  in effect (meaning this test can be used) for the duration of the COVID-19 declaration under Section 564(b)(1) of the Act, 21 U.S.C.section 360bbb-3(b)(1), unless the authorization is terminated  or revoked sooner.       Influenza A by PCR NEGATIVE NEGATIVE Final   Influenza B by PCR NEGATIVE NEGATIVE Final    Comment: (NOTE) The Xpert Xpress SARS-CoV-2/FLU/RSV plus assay is intended as an aid in the diagnosis of influenza from Nasopharyngeal swab specimens and should not be used as a sole basis for treatment. Nasal washings and aspirates are  unacceptable for Xpert Xpress SARS-CoV-2/FLU/RSV testing.  Fact Sheet for Patients: EntrepreneurPulse.com.au  Fact Sheet for Healthcare Providers: IncredibleEmployment.be  This test is not yet approved or cleared by the [place] FDA and has been authorized for detection and/or diagnosis of SARS-CoV-2 by FDA under an Emergency Use Authorization (EUA). This EUA will remain in effect (meaning this test can be used) for the duration of the COVID-19 declaration under Section 564(b)(1) of the Act,  21 U.S.C. section 360bbb-3(b)(1), unless the authorization is terminated or revoked.  Performed at [facility], [address] [place], [place] [address]   MRSA Next Gen by PCR, Nasal     Status: None   Collection Time: 03/13/22 11:29 PM   Specimen: Nasal Mucosa; Nasal Swab  Result Value Ref Range Status   MRSA by PCR Next Gen NOT DETECTED NOT DETECTED Final    Comment: (NOTE) The GeneXpert MRSA Assay (FDA approved for NASAL specimens only), is one component of a comprehensive MRSA colonization surveillance program. It is not intended to diagnose MRSA infection nor to guide or monitor treatment for MRSA infections. Test performance is not FDA approved in patients less than 2 years old. Performed at [facility], [address] [place], [place] [address]     Antimicrobials: Anti-infectives (From admission, onward)    None      Culture/Microbiology No results found for: "SDES", "SPECREQUEST", "CULT", "REPTSTATUS"   Radiology Studies: No results found.   LOS: 4 days   Lanae Boast, MD [facility]  03/17/2022, 1:02 PM

## 2022-03-18 DIAGNOSIS — I2111 ST elevation (STEMI) myocardial infarction involving right coronary artery: Secondary | ICD-10-CM | POA: Diagnosis not present

## 2022-03-18 LAB — GLUCOSE, CAPILLARY: Glucose-Capillary: 153 mg/dL — ABNORMAL HIGH (ref 70–99)

## 2022-03-18 MED ORDER — ATORVASTATIN CALCIUM 80 MG PO TABS: 80.0000 mg | ORAL_TABLET | Freq: Every day | ORAL | 0 refills | Status: AC

## 2022-03-18 MED ORDER — METOPROLOL TARTRATE 50 MG PO TABS: 50.0000 mg | ORAL_TABLET | Freq: Two times a day (BID) | ORAL | 0 refills | Status: AC

## 2022-03-18 MED ORDER — EMPAGLIFLOZIN 10 MG PO TABS: 10.0000 mg | ORAL_TABLET | Freq: Every day | ORAL | 0 refills | Status: AC

## 2022-03-18 MED ORDER — VITAMIN B-1 100 MG PO TABS: 100.0000 mg | ORAL_TABLET | Freq: Every day | ORAL | 0 refills | Status: AC

## 2022-03-18 MED ORDER — TICAGRELOR 90 MG PO TABS: 90.0000 mg | ORAL_TABLET | Freq: Two times a day (BID) | ORAL | 0 refills | Status: AC

## 2022-03-18 NOTE — Discharge Summary (Signed)
Physician Discharge Summary  Willie GAVIA HWE:[id number] DOB: 1961/04/05 DOA: 03/13/2022  PCP: Patient, No Pcp Per  Admit date: 03/13/2022 Discharge date: 03/18/2022 Recommendations for Outpatient Follow-up:  Follow up with PCP in 1 weeks-call for appointment Please check blood sugar baseline before meals and bedtime Follow-up with [facility] You will need referral to endocrinology You will need sleep apnea evaluation  Discharge Dispo: Home Discharge Condition: Stable Code Status:   Code Status: Full Code Diet recommendation:  Diet Order             Diet Carb Modified Fluid consistency: Thin; Room service appropriate? Yes  Diet effective now                   Brief/Interim Summary: 61 y.o. obese Caucasian male with medical history significant for CAD, combined systolic and diastolic CHF essential hypertension, emphysema, OSA, psoriatic arthritis, depression, dyslipidemia and history of tobacco and alcohol abuse, who presented to the ED with acute onset of chest pain. In ED;  Peak troponin was more than 24,000.  EKG showed ST elevation. Patient with emergency brought to the Cath Lab, drug-eluting eluding stent was placed in RCA, patient started on dual antiplatelet treatment. Overnight over the procedure, patient developed significant agitation and confusion.  He was also placed on BiPAP for hypoxemia.  Placed on IV steroids for possible COPD exacerbation. 11/7, he is back on 4 L oxygen Transferred out of ICU. Overall patient is much improved is now on 2 L nasal cannula ambulating and has baseline dyspnea on exertion.  No more wheezing he is finishing his steroid with plan to quickly taper given his new onset diabetes. Seen by diabetes coordinator educated on blood glucose monitoring PCP follow-up.  Started on [place] here. Remains chest pain free   Overall mentation improved, he was able to come off oxygen, hypoxia has resolved.  He has been doing well seen by cardiology  and okay for discharge. Overnight pulse oximetry stable no hypoxia.  Discharge Diagnoses:  Principal Problem:   STEMI involving right coronary artery (HCC) Active Problems:   Essential hypertension   Dyslipidemia   COPD with acute exacerbation (HCC)   Psoriatic arthritis (HCC)   Chronic HFrEF (heart failure with reduced ejection fraction) (HCC)   Acute ST elevation myocardial infarction (STEMI) involving other coronary artery of inferior wall (HCC)   Obstructive sleep apnea   NSVT (nonsustained ventricular tachycardia) (HCC)   Acute hypoxemic respiratory failure (HCC)   Alcohol withdrawal delirium (HCC)   Obesity, Class III, BMI 40-49.9 (morbid obesity) ([place]) Acute hypoxic respiratory failure due to COPD exacerbation: Acute COPD exacerbation: Patient had respiratory distress as well as hypoxemia 11/5 night in the setting of altered mental status and agitation.  Managed with BiPAP supplemental oxygen systemic steroids.  Overall respiratory status stable currently off o2- negative ONO> Off steroid-bronchodilator/DuoNeb,Dulera at home. Has not needed BiPAP and off oxygen  STEMI: s/p RCA DES stent placement on DAPT with statin, per cardiology.  NSVT: Stable, on metoprolol, on monitor. Chronic systolic CHF: EF with 89-38%, normal RV systolic function mitral valve aortic valve normal.  Euvolemic, continue current metoprolol, losartan and Aldactone.   Delirium tremens with alcohol abuse: Resolved.   Psoriasis w/ Psoriatic arthritis: With chronic skin rashes and psoriatic arthritis, on methotrexate and Humira. New onset diabetes: A1c 6.8 seen by diabetes coordinator> continue Jardiance.His wife will monitor his cbg as she is diabetic he says. DM coordinator input appreciated Morbid Obesity:Patient's Body mass index is 41.77 kg/m. : Will  benefit with PCP follow-up, weight loss  healthy lifestyle and outpatient sleep evaluation. Voiding off  foley  Consults: Cardiology Subjective:   Discharge Exam: Vitals:   03/18/22 0730 03/18/22 0738  BP: 116/72   Pulse: 79   Resp: 20   Temp: 97.7 F (36.5 C)   SpO2: 95% 93%   General: Pt is alert, awake, not in acute distress Cardiovascular: RRR, S1/S2 +, no rubs, no gallops Respiratory: CTA bilaterally, no wheezing, no rhonchi Abdominal: Soft, NT, ND, bowel sounds + Extremities: no edema, no cyanosis  Discharge Instructions  Discharge Instructions     AMB Referral to Cardiac Rehabilitation - Phase II   Complete by: As directed    Diagnosis: STEMI   After initial evaluation and assessments completed: Virtual Based Care may be provided alone or in conjunction with Phase 2 Cardiac Rehab based on patient barriers.: Yes   Discharge instructions   Complete by: As directed    Please call call MD or return to ER for similar or worsening recurring problem that brought you to hospital or if any fever,nausea/vomiting,abdominal pain, uncontrolled pain, chest pain,  shortness of breath or any other alarming symptoms.  Please follow-up your doctor as instructed in a week time and call the office for appointment.  Please avoid alcohol, smoking, or any other illicit substance and maintain healthy habits including taking your regular medications as prescribed.  You were cared for by a hospitalist during your hospital stay. If you have any questions about your discharge medications or the care you received while you were in the hospital after you are discharged, you can call the unit and ask to speak with the hospitalist on call if the hospitalist that took care of you is not available.  Once you are discharged, your primary care physician will handle any further medical issues. Please note that NO REFILLS for any discharge medications will be authorized once you are discharged, as it is imperative that you return to your primary care physician (or establish a relationship with a primary care  physician if you do not have one) for your aftercare needs so that they can reassess your need for medications and monitor your lab values   Check blood sugar 3 times a day and bedtime at home. If blood sugar running above 200 less than 70 please call your MD to adjust insulin. If blood sugars running less 100 do not use insulin and call MD. If you noticed signs and symptoms of hypoglycemia or low blood sugar like jitteriness, confusion, thirst, tremor, sweating- Check blood sugar, drink sugary drink/biscuits/sweets to increase sugar level and call MD or return to ER.   Increase activity slowly   Complete by: As directed       Allergies as of 03/18/2022   No Known Allergies      Medication List     TAKE these medications    albuterol 108 (90 Base) MCG/ACT inhaler Commonly known as: VENTOLIN HFA Inhale 1-2 puffs into the lungs every 4 (four) hours as needed.   albuterol (2.5 MG/3ML) 0.083% nebulizer solution Commonly known as: PROVENTIL Take 2.5 mg by nebulization every 4 (four) hours as needed.   aspirin EC 81 MG tablet Take 1 tablet by mouth daily.   atorvastatin 80 MG tablet Commonly known as: LIPITOR Take 1 tablet (80 mg total) by mouth daily. Start taking on: March 19, 2022 What changed:  medication strength how much to take   empagliflozin 10 MG Tabs tablet Commonly known as: JARDIANCE  Take 1 tablet (10 mg total) by mouth daily. Start taking on: March 19, 9766   folic acid 1 MG tablet Commonly known as: FOLVITE Take 1 tablet by mouth daily.   gabapentin 300 MG capsule Commonly known as: NEURONTIN Take 600 mg by mouth at bedtime.   Humira Pen 40 MG/0.4ML Pnkt Generic drug: Adalimumab Inject 1 Dose into the skin once a week.   ipratropium-albuterol 0.5-2.5 (3) MG/3ML Soln Commonly known as: DUONEB Inhale 3 mLs into the lungs every 6 (six) hours as needed.   losartan 100 MG tablet Commonly known as: COZAAR Take 100 mg by mouth daily.    methotrexate 2.5 MG tablet Commonly known as: RHEUMATREX Take 20 mg by mouth once a week. Caution:Chemotherapy. Protect from light.   metoprolol tartrate 50 MG tablet Commonly known as: LOPRESSOR Take 1 tablet (50 mg total) by mouth 2 (two) times daily. What changed:  medication strength how much to take   oxyCODONE 5 MG immediate release tablet Commonly known as: Oxy IR/ROXICODONE Take 5 mg by mouth 4 (four) times daily as needed.   spironolactone 25 MG tablet Commonly known as: ALDACTONE Take 25 mg by mouth daily.   thiamine 100 MG tablet Commonly known as: Vitamin B-1 Take 1 tablet (100 mg total) by mouth daily. Start taking on: March 19, 2022   ticagrelor 90 MG Tabs tablet Commonly known as: BRILINTA Take 1 tablet (90 mg total) by mouth 2 (two) times daily.   Trelegy Ellipta 100-62.5-25 MCG/ACT Aepb Generic drug: Fluticasone-Umeclidin-Vilant Inhale 1 puff into the lungs daily.        Follow-up Information     Juanita Laster., MD. Go in 1 week(s).   Specialty: Cardiology Contact information: [address] [place] [address] [phone]         PCP Follow up in 1 week(s).          [facility] Follow up in 1 week(s).   Contact information: [place] [address] [phone]                No Known Allergies  The results of significant diagnostics from this hospitalization (including imaging, microbiology, ancillary and laboratory) are listed below for reference.    Microbiology: Recent Results (from the past 240 hour(s))  Resp Panel by RT-PCR (Flu A&B, Covid) Anterior Nasal Swab     Status: None   Collection Time: 03/13/22 10:02 PM   Specimen: Anterior Nasal Swab  Result Value Ref Range Status   SARS Coronavirus 2 by RT PCR NEGATIVE NEGATIVE Final    Comment: (NOTE) SARS-CoV-2 target nucleic acids are NOT DETECTED.  The SARS-CoV-2 RNA is generally detectable in upper  respiratory specimens during the acute phase of infection. The lowest concentration of SARS-CoV-2 viral copies this assay can detect is 138 copies/mL. A negative result does not preclude SARS-Cov-2 infection and should not be used as the sole basis for treatment or other patient management decisions. A negative result may occur with  improper specimen collection/handling, submission of specimen other than nasopharyngeal swab, presence of viral mutation(s) within the areas targeted by this assay, and inadequate number of viral copies(<138 copies/mL). A negative result must be combined with clinical observations, patient history, and epidemiological information. The expected result is Negative.  Fact Sheet for Patients:  EntrepreneurPulse.com.au  Fact Sheet for Healthcare Providers:  IncredibleEmployment.be  This test is no t yet approved or cleared by the [place] FDA and  has  been authorized for detection and/or diagnosis of SARS-CoV-2 by FDA under an Emergency Use Authorization (EUA). This EUA will remain  in effect (meaning this test can be used) for the duration of the COVID-19 declaration under Section 564(b)(1) of the Act, 21 U.S.C.section 360bbb-3(b)(1), unless the authorization is terminated  or revoked sooner.       Influenza A by PCR NEGATIVE NEGATIVE Final   Influenza B by PCR NEGATIVE NEGATIVE Final    Comment: (NOTE) The Xpert Xpress SARS-CoV-2/FLU/RSV plus assay is intended as an aid in the diagnosis of influenza from Nasopharyngeal swab specimens and should not be used as a sole basis for treatment. Nasal washings and aspirates are unacceptable for Xpert Xpress SARS-CoV-2/FLU/RSV testing.  Fact Sheet for Patients: EntrepreneurPulse.com.au  Fact Sheet for Healthcare Providers: IncredibleEmployment.be  This test is not yet approved or cleared by the [place] FDA and has been  authorized for detection and/or diagnosis of SARS-CoV-2 by FDA under an Emergency Use Authorization (EUA). This EUA will remain in effect (meaning this test can be used) for the duration of the COVID-19 declaration under Section 564(b)(1) of the Act, 21 U.S.C. section 360bbb-3(b)(1), unless the authorization is terminated or revoked.  Performed at [facility], [address] [place], [place] [address]   MRSA Next Gen by PCR, Nasal     Status: None   Collection Time: 03/13/22 11:29 PM   Specimen: Nasal Mucosa; Nasal Swab  Result Value Ref Range Status   MRSA by PCR Next Gen NOT DETECTED NOT DETECTED Final    Comment: (NOTE) The GeneXpert MRSA Assay (FDA approved for NASAL specimens only), is one component of a comprehensive MRSA colonization surveillance program. It is not intended to diagnose MRSA infection nor to guide or monitor treatment for MRSA infections. Test performance is not FDA approved in patients less than 14 years old. Performed at [facility], [address] [place], [place] [address]     Procedures/Studies: ECHOCARDIOGRAM COMPLETE  Result Date: 03/15/2022    ECHOCARDIOGRAM REPORT   Patient Name:   Willie Santos Date of Exam: 03/14/2022 Medical Rec #:  [id number]           Height:       72.0 in Accession #:    [id number]          Weight:       308.0 lb Date of Birth:  05/27/1960            BSA:          2.560 m Patient Age:    35 years            BP:           104/69 mmHg Patient Gender: M                   HR:           86 bpm. Exam Location:  [facility] Procedure: 2D Echo, Cardiac Doppler, Color Doppler and Intracardiac            Opacification Agent Indications:     Acute Myocardial Infarction, Unspecified I21.9  History:         Patient has no prior history of Echocardiogram examinations.  Sonographer:     Mikki Santee RDCS Referring Phys:  [id number] [place] TANG Diagnosing Phys: Isaias Cowman MD IMPRESSIONS  1. Left ventricular  ejection fraction, by estimation, is 35 to 40%. The left ventricle has moderately decreased function. The left ventricle  demonstrates global hypokinesis. The left ventricular internal cavity size was mildly to moderately dilated. Left ventricular diastolic parameters were normal.  2. Right ventricular systolic function is normal. The right ventricular size is normal.  3. The mitral valve is normal in structure. Moderate mitral valve regurgitation. No evidence of mitral stenosis.  4. The aortic valve is normal in structure. Aortic valve regurgitation is not visualized. Mild aortic valve stenosis.  5. The inferior vena cava is normal in size with greater than 50% respiratory variability, suggesting right atrial pressure of 3 mmHg. FINDINGS  Left Ventricle: Left ventricular ejection fraction, by estimation, is 35 to 40%. The left ventricle has moderately decreased function. The left ventricle demonstrates global hypokinesis. Definity contrast agent was given IV to delineate the left ventricular endocardial borders. The left ventricular internal cavity size was mildly to moderately dilated. There is no left ventricular hypertrophy. Left ventricular diastolic parameters were normal. Right Ventricle: The right ventricular size is normal. No increase in right ventricular wall thickness. Right ventricular systolic function is normal. Left Atrium: Left atrial size was normal in size. Right Atrium: Right atrial size was normal in size. Pericardium: There is no evidence of pericardial effusion. Mitral Valve: The mitral valve is normal in structure. Moderate mitral valve regurgitation. No evidence of mitral valve stenosis. Tricuspid Valve: The tricuspid valve is normal in structure. Tricuspid valve regurgitation is not demonstrated. No evidence of tricuspid stenosis. Aortic Valve: The aortic valve is normal in structure. Aortic valve regurgitation is not visualized. Mild aortic stenosis is present. Aortic valve mean gradient  measures 12.0 mmHg. Aortic valve peak gradient measures 21.2 mmHg. Aortic valve area, by VTI measures 1.87 cm. Pulmonic Valve: The pulmonic valve was normal in structure. Pulmonic valve regurgitation is not visualized. No evidence of pulmonic stenosis. Aorta: The aortic root is normal in size and structure. Venous: The inferior vena cava is normal in size with greater than 50% respiratory variability, suggesting right atrial pressure of 3 mmHg. IAS/Shunts: No atrial level shunt detected by color flow Doppler.  LEFT VENTRICLE PLAX 2D LVIDd:         7.10 cm   Diastology LVIDs:         5.80 cm   LV e' medial:    5.92 cm/s LV PW:         1.20 cm   LV E/e' medial:  16.2 LV IVS:        0.90 cm   LV e' lateral:   11.70 cm/s LVOT diam:     2.70 cm   LV E/e' lateral: 8.2 LV SV:         77 LV SV Index:   30 LVOT Area:     5.73 cm  RIGHT VENTRICLE RV S prime:     15.10 cm/s TAPSE (M-mode): 2.9 cm LEFT ATRIUM              Index LA diam:        4.90 cm  1.91 cm/m LA Vol (A2C):   103.0 ml 40.24 ml/m LA Vol (A4C):   65.8 ml  25.71 ml/m LA Biplane Vol: 86.5 ml  33.79 ml/m  AORTIC VALVE AV Area (Vmax):    1.85 cm AV Area (Vmean):   1.77 cm AV Area (VTI):     1.87 cm AV Vmax:           230.00 cm/s AV Vmean:          161.000 cm/s AV VTI:  0.411 m AV Peak Grad:      21.2 mmHg AV Mean Grad:      12.0 mmHg LVOT Vmax:         74.40 cm/s LVOT Vmean:        49.800 cm/s LVOT VTI:          0.134 m LVOT/AV VTI ratio: 0.33  AORTA Ao Root diam: 3.40 cm MITRAL VALVE MV Area (PHT): 3.99 cm       SHUNTS MV Decel Time: 190 msec       Systemic VTI:  0.13 m MR Peak grad:    100.4 mmHg   Systemic Diam: 2.70 cm MR Mean grad:    60.0 mmHg MR Vmax:         501.00 cm/s MR Vmean:        366.0 cm/s MR PISA:         4.02 cm MR PISA Eff ROA: 30 mm MR PISA Radius:  0.80 cm MV E velocity: 95.90 cm/s MV A velocity: 86.50 cm/s MV E/A ratio:  1.11 Isaias Cowman MD Electronically signed by Isaias Cowman MD Signature Date/Time:  03/15/2022/1:24:09 PM    Final    CARDIAC CATHETERIZATION  Result Date: 03/14/2022   Mid RCA to Dist RCA lesion is 100% stenosed.   Mid Cx lesion is 50% stenosed.   Previously placed Ost LAD to Mid LAD stent of unknown type is  widely patent.   A drug-eluting stent was successfully placed using a STENT ONYX FRONTIER [id number].   Post intervention, there is a 0% residual stenosis.   There is mild left ventricular systolic dysfunction.   LV end diastolic pressure is moderately elevated.   The left ventricular ejection fraction is 50-55% by visual estimate. Conclusion Status post emergent cardiac catheter because of STEMI Patient had relatively preserved left ventricular function EF around 50% left ventricular enlargement Coronaries Unremarkable left main LAD circumflex no significant disease Widely patent proximal to mid LAD stent moderate disease in circumflex all with TIMI-3 flow RCA Large 100% mid to distal IRA TIMI 0 flow Intervention Successful PCI and stent to mid to distal RCA with DES 3.0 x 18 mm [place] Onyx to 13 atm Lesion reduced from 100 down to 0% TIMI 0 flow back to TIMI-3 flow Procedure done radially TR band applied Patient maintained on aspirin and Brilinta statin beta-blocker ACE Aggrastat for 18 hours Patient tolerated procedure well No complications   DG Chest Port 1 View  Result Date: 03/14/2022 CLINICAL DATA:  Evaluate for aspiration pneumonia. EXAM: PORTABLE CHEST 1 VIEW COMPARISON:  03/13/2022 FINDINGS: Stable cardiomediastinal contours. No signs of pleural effusion. No airspace opacities identified. Unchanged mild diffuse increase interstitial markings. Visualized osseous structures appear intact. IMPRESSION: Unchanged mild diffuse increase interstitial markings. No airspace opacities identified. Electronically Signed   By: Kerby Moors M.D.   On: 03/14/2022 15:06   DG Chest Port 1 View  Result Date: 03/13/2022 CLINICAL DATA:  Chest pain; STEMI suspected EXAM: PORTABLE CHEST 1 VIEW  COMPARISON:  radiographs 05/24/2008 FINDINGS: Defibrillator pad over the left chest. Mild cardiomegaly. Bilateral interstitial coarsening. No focal consolidation, pleural effusion, or pneumothorax. No acute osseous abnormality. IMPRESSION: Bilateral interstitial coarsening may be due to COPD though superimposed edema could be considered. Cardiomegaly. Electronically Signed   By: Placido Sou M.D.   On: 03/13/2022 22:11    Labs: BNP (last 3 results) Recent Labs    03/14/22 0926  BNP 809.9*   Basic Metabolic Panel: Recent Labs  Lab 03/13/22  2155 03/14/22 0926 03/15/22 0518 03/16/22 0518  NA 137 140 140 139  K 4.5 5.0 4.7 4.6  CL 102 103 103 98  CO2 26 26 33* 33*  GLUCOSE 199* 195* 179* 206*  BUN 21 26* 24* 24*  CREATININE 1.11 0.92 0.55* 0.71  CALCIUM 9.3 9.6 8.8* 9.1  MG  --   --  2.2 2.2  PHOS  --   --  3.4 2.8   Liver Function Tests: Recent Labs  Lab 03/13/22 2155  AST 32  ALT 23  ALKPHOS 48  BILITOT 0.7  PROT 7.9  ALBUMIN 4.0   No results for input(s): "LIPASE", "AMYLASE" in the last 168 hours. No results for input(s): "AMMONIA" in the last 168 hours. CBC: Recent Labs  Lab 03/13/22 2155 03/14/22 0926 03/15/22 0518 03/17/22 0812  WBC 10.2 9.4 14.8*  --   NEUTROABS 7.0  --   --   --   HGB 14.6 16.1 13.9 13.7  HCT 44.1 49.1 41.9 42.2  MCV 95.5 96.1 96.3  --   PLT 261 299 266  --    Cardiac Enzymes: No results for input(s): "CKTOTAL", "CKMB", "CKMBINDEX", "TROPONINI" in the last 168 hours. BNP: Invalid input(s): "POCBNP" CBG: Recent Labs  Lab 03/17/22 0803 03/17/22 1125 03/17/22 1655 03/17/22 2051 03/18/22 0902  GLUCAP 194* 227* 186* 148* 153*   D-Dimer No results for input(s): "DDIMER" in the last 72 hours. Hgb A1c No results for input(s): "HGBA1C" in the last 72 hours. Lipid Profile No results for input(s): "CHOL", "HDL", "LDLCALC", "TRIG", "CHOLHDL", "LDLDIRECT" in the last 72 hours. Thyroid function studies No results for input(s):  "TSH", "T4TOTAL", "T3FREE", "THYROIDAB" in the last 72 hours.  Invalid input(s): "FREET3" Anemia work up No results for input(s): "VITAMINB12", "FOLATE", "FERRITIN", "TIBC", "IRON", "RETICCTPCT" in the last 72 hours. Urinalysis No results found for: "COLORURINE", "APPEARANCEUR", "LABSPEC", "PHURINE", "GLUCOSEU", "HGBUR", "BILIRUBINUR", "KETONESUR", "PROTEINUR", "UROBILINOGEN", "NITRITE", "LEUKOCYTESUR" Sepsis Labs Recent Labs  Lab 03/13/22 2155 03/14/22 0926 03/15/22 0518  WBC 10.2 9.4 14.8*   Microbiology Recent Results (from the past 240 hour(s))  Resp Panel by RT-PCR (Flu A&B, Covid) Anterior Nasal Swab     Status: None   Collection Time: 03/13/22 10:02 PM   Specimen: Anterior Nasal Swab  Result Value Ref Range Status   SARS Coronavirus 2 by RT PCR NEGATIVE NEGATIVE Final    Comment: (NOTE) SARS-CoV-2 target nucleic acids are NOT DETECTED.  The SARS-CoV-2 RNA is generally detectable in upper respiratory specimens during the acute phase of infection. The lowest concentration of SARS-CoV-2 viral copies this assay can detect is 138 copies/mL. A negative result does not preclude SARS-Cov-2 infection and should not be used as the sole basis for treatment or other patient management decisions. A negative result may occur with  improper specimen collection/handling, submission of specimen other than nasopharyngeal swab, presence of viral mutation(s) within the areas targeted by this assay, and inadequate number of viral copies(<138 copies/mL). A negative result must be combined with clinical observations, patient history, and epidemiological information. The expected result is Negative.  Fact Sheet for Patients:  EntrepreneurPulse.com.au  Fact Sheet for Healthcare Providers:  IncredibleEmployment.be  This test is no t yet approved or cleared by the [place] FDA and  has been authorized for detection and/or diagnosis of SARS-CoV-2  by FDA under an Emergency Use Authorization (EUA). This EUA will remain  in effect (meaning this test can be used) for the duration of the COVID-19 declaration under Section 564(b)(1) of the Act,  21 U.S.C.section 360bbb-3(b)(1), unless the authorization is terminated  or revoked sooner.       Influenza A by PCR NEGATIVE NEGATIVE Final   Influenza B by PCR NEGATIVE NEGATIVE Final    Comment: (NOTE) The Xpert Xpress SARS-CoV-2/FLU/RSV plus assay is intended as an aid in the diagnosis of influenza from Nasopharyngeal swab specimens and should not be used as a sole basis for treatment. Nasal washings and aspirates are unacceptable for Xpert Xpress SARS-CoV-2/FLU/RSV testing.  Fact Sheet for Patients: EntrepreneurPulse.com.au  Fact Sheet for Healthcare Providers: IncredibleEmployment.be  This test is not yet approved or cleared by the [place] FDA and has been authorized for detection and/or diagnosis of SARS-CoV-2 by FDA under an Emergency Use Authorization (EUA). This EUA will remain in effect (meaning this test can be used) for the duration of the COVID-19 declaration under Section 564(b)(1) of the Act, 21 U.S.C. section 360bbb-3(b)(1), unless the authorization is terminated or revoked.  Performed at [facility], [address] [place], [place] [address]   MRSA Next Gen by PCR, Nasal     Status: None   Collection Time: 03/13/22 11:29 PM   Specimen: Nasal Mucosa; Nasal Swab  Result Value Ref Range Status   MRSA by PCR Next Gen NOT DETECTED NOT DETECTED Final    Comment: (NOTE) The GeneXpert MRSA Assay (FDA approved for NASAL specimens only), is one component of a comprehensive MRSA colonization surveillance program. It is not intended to diagnose MRSA infection nor to guide or monitor treatment for MRSA infections. Test performance is not FDA approved in patients less than 9 years old. Performed at [facility],  [address], [place], [place] [address]   Time coordinating discharge: 35 minutes  SIGNED: Antonieta Pert, MD  [facility] 03/18/2022, 10:29 AM  If 7PM-7AM, please contact night-coverage [email or web address]

## 2022-03-18 NOTE — Progress Notes (Signed)
Pulse ox, overnight done on room air. Report can be found in Pt chart.

## 2022-03-18 NOTE — Care Management Important Message (Signed)
Important Message  Patient Details  Name: Willie Santos MRN: [id number] Date of Birth: [birth year]   Medicare Important Message Given:  Yes     Johnell Comings 03/18/2022, 10:56 AM

## 2022-03-18 NOTE — Discharge Instructions (Signed)

## 2022-03-18 NOTE — Plan of Care (Signed)
  RD consulted for nutrition education regarding diabetes.   Pt reports constant snacking daily. He eats a wide variety of foods. His wife has diabetes but he does not follow a diabetic diet at home. He typically will eat a whole container of ice cream and will drink high quantities of beer but plans to cut back on amount he drinks. He does not usually drink soda or sugar sweetened beverages and drinks water.   Lab Results  Component Value Date   HGBA1C 6.8 (H) 03/13/2022    RD provided "Plate Method" and "Carbohydrate Counting for People with Diabetes" handout from the [employer] of [employer]etics. Discussed different food groups and their effects on blood sugar, emphasizing carbohydrate-containing foods. Provided list of carbohydrates and recommended serving sizes of common foods.  Discussed importance of controlled and consistent carbohydrate intake throughout the day. Provided examples of ways to balance meals/snacks and encouraged intake of high-fiber, whole grain complex carbohydrates. Teach back method used.  Expect fair compliance.  Body mass index is 41.77 kg/m. Pt meets criteria for obesity based on current BMI.  Current diet order is Carb modified, patient is consuming approximately 100% of meals at this time. Labs and medications reviewed. No further nutrition interventions warranted at this time. RD contact information provided. If additional nutrition issues arise, please re-consult RD.  Drusilla Kanner, RDN, LDN Clinical Nutrition
